# Patient Record
Sex: Male | Born: 1970 | Race: White | Hispanic: No | Marital: Married | Smoking: Never smoker
Health system: Southern US, Community
[De-identification: ages and names within clinical notes are randomized; demographics above are authoritative.]

## PROBLEM LIST (undated history)

## (undated) DIAGNOSIS — K219 Gastro-esophageal reflux disease without esophagitis: Secondary | ICD-10-CM

## (undated) DIAGNOSIS — T7840XA Allergy, unspecified, initial encounter: Secondary | ICD-10-CM

## (undated) DIAGNOSIS — M1990 Unspecified osteoarthritis, unspecified site: Secondary | ICD-10-CM

## (undated) HISTORY — DX: Allergy, unspecified, initial encounter: T78.40XA

## (undated) HISTORY — PX: COLONOSCOPY: SHX174

## (undated) HISTORY — DX: Unspecified osteoarthritis, unspecified site: M19.90

## (undated) HISTORY — DX: Gastro-esophageal reflux disease without esophagitis: K21.9

## (undated) HISTORY — PX: ANTERIOR CRUCIATE LIGAMENT REPAIR: SHX115

---

## 2013-03-02 ENCOUNTER — Ambulatory Visit: Payer: Self-pay | Admitting: Sports Medicine

## 2013-09-07 ENCOUNTER — Ambulatory Visit: Payer: Self-pay | Admitting: Sports Medicine

## 2013-09-09 ENCOUNTER — Ambulatory Visit: Payer: Self-pay | Admitting: Sports Medicine

## 2016-05-08 ENCOUNTER — Encounter: Payer: Self-pay | Admitting: Sports Medicine

## 2016-05-08 ENCOUNTER — Ambulatory Visit (INDEPENDENT_AMBULATORY_CARE_PROVIDER_SITE_OTHER): Payer: BC Managed Care – PPO | Admitting: Sports Medicine

## 2016-05-08 VITALS — BP 106/72 | HR 73 | Resp 18 | Wt 217.4 lb

## 2016-05-08 DIAGNOSIS — Z23 Encounter for immunization: Secondary | ICD-10-CM | POA: Diagnosis not present

## 2016-05-08 DIAGNOSIS — T161XXA Foreign body in right ear, initial encounter: Secondary | ICD-10-CM

## 2016-05-08 DIAGNOSIS — Z9622 Myringotomy tube(s) status: Secondary | ICD-10-CM | POA: Insufficient documentation

## 2016-05-08 DIAGNOSIS — Z0000 Encounter for general adult medical examination without abnormal findings: Secondary | ICD-10-CM

## 2016-05-08 NOTE — Addendum Note (Signed)
Addended by: Baird KayUGLAS, Mckenize Mezera M on: 05/08/2016 09:57 AM   Modules accepted: Orders

## 2016-05-08 NOTE — Assessment & Plan Note (Signed)
Removal with instrumentation of a detached tympanostomy tube.

## 2016-05-08 NOTE — Progress Notes (Signed)
  Subjective:    CC: Establish care/Physical.   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  Past medical history, Surgical history, Family history not pertinant except as noted below, Social history, Allergies, and medications have been entered into the medical record, reviewed, and no changes needed.   Review of Systems: No headache, visual changes, nausea, vomiting, diarrhea, constipation, dizziness, abdominal pain, skin rash, fevers, chills, night sweats, swollen lymph nodes, weight loss, chest pain, body aches, joint swelling, muscle aches, shortness of breath, mood changes, visual or auditory hallucinations.  Objective:    General: Well Developed, well nourished, and in no acute distress.  Neuro: Alert and oriented x3, extra-ocular muscles intact, sensation grossly intact. Cranial nerves II through XII are intact, motor, sensory, and coordinative functions are all intact. HEENT: Normocephalic, atraumatic, pupils equal round reactive to light, neck supple, no masses, no lymphadenopathy, thyroid nonpalpable. Oropharynx, nasopharynx, external ear canals are unremarkable with the exception of a foreign body visualized in the right external canal. Skin: Warm and dry, no rashes noted.  Cardiac: Regular rate and rhythm, no murmurs rubs or gallops.  Respiratory: Clear to auscultation bilaterally. Not using accessory muscles, speaking in full sentences.  Abdominal: Soft, nontender, nondistended, positive bowel sounds, no masses, no organomegaly.  Musculoskeletal: Shoulder, elbow, wrist, hip, knee, ankle stable, and with full range of motion.  Procedure:  Removal of right ear canal foreign body. Risks, benefits, alternatives explained to patient. Consent obtained. Time out conducted.  Using alligator forceps I grasped the  foreign body under direct visualization and it was removed.  Impression and Recommendations:    The patient was counselled, risk factors were discussed, anticipatory guidance given.  Foreign body of ear, right Removal with instrumentation of a detached tympanostomy tube.  Annual physical exam Unremarkable physical, tetanus and flu vaccines. Routine blood work ordered. Return in one year.

## 2016-05-08 NOTE — Assessment & Plan Note (Signed)
Unremarkable physical, tetanus and flu vaccines. Routine blood work ordered. Return in one year.

## 2016-05-13 ENCOUNTER — Encounter: Payer: Self-pay | Admitting: Sports Medicine

## 2016-05-13 ENCOUNTER — Ambulatory Visit (INDEPENDENT_AMBULATORY_CARE_PROVIDER_SITE_OTHER): Payer: BC Managed Care – PPO | Admitting: Sports Medicine

## 2016-05-13 DIAGNOSIS — H6091 Unspecified otitis externa, right ear: Secondary | ICD-10-CM

## 2016-05-13 MED ORDER — CIPROFLOXACIN-DEXAMETHASONE 0.3-0.1 % OT SUSP: 4.0000 [drp] | Freq: Two times a day (BID) | OTIC | 0 refills | Status: AC

## 2016-05-13 MED ORDER — AZITHROMYCIN 250 MG PO TABS: ORAL_TABLET | ORAL | 0 refills | Status: DC

## 2016-05-13 NOTE — Progress Notes (Signed)
  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  Unfortunately for the past couple of days she's had increasing pain and pressure in his right ear. Moderate, persistent without radiation. No constitutional symptoms, minimal upper respiratory symptoms including a mild nonproductive cough. No shortness of breath or chest pain.  Past medical history, Surgical history, Family history not pertinant except as noted below, Social history, Allergies, and medications have been entered into the medical record, reviewed, and no changes needed.   Review of Systems: No fevers, chills, night sweats, weight loss, chest pain, or shortness of breath.   Objective:    General: Well Developed, well nourished, and in no acute distress.  Neuro: Alert and oriented x3, extra-ocular muscles intact, sensation grossly intact.  HEENT: Normocephalic, atraumatic, pupils equal round reactive to light, neck supple, no masses, no lymphadenopathy, thyroid nonpalpable. Oropharynx, nasopharynx unremarkable, right ear canal is erythematous and swollen, minimal erythema with bulging and the tympanic membrane itself. Skin: Warm and dry, no rashes. Cardiac: Regular rate and rhythm, no murmurs rubs or gallops, no lower extremity edema.  Respiratory: Clear to auscultation bilaterally. Not using accessory muscles, speaking in full sentences.  Impression and Recommendations:    Otitis externa of right ear With some element of otitis media with tympanic erythema. Adding Ciprodex and azithromycin.  I spent 25 minutes with this patient, greater than 50% was face-to-face time counseling regarding the above diagnoses

## 2016-05-13 NOTE — Assessment & Plan Note (Signed)
With some element of otitis media with tympanic erythema. Adding Ciprodex and azithromycin.

## 2016-07-02 ENCOUNTER — Encounter: Payer: Self-pay | Admitting: Sports Medicine

## 2016-07-02 ENCOUNTER — Ambulatory Visit (INDEPENDENT_AMBULATORY_CARE_PROVIDER_SITE_OTHER): Payer: BC Managed Care – PPO | Admitting: Sports Medicine

## 2016-07-02 DIAGNOSIS — Z0000 Encounter for general adult medical examination without abnormal findings: Secondary | ICD-10-CM | POA: Diagnosis not present

## 2016-07-02 DIAGNOSIS — H6091 Unspecified otitis externa, right ear: Secondary | ICD-10-CM

## 2016-07-02 NOTE — Assessment & Plan Note (Signed)
Resolved, he did get his tubes placed again.

## 2016-07-02 NOTE — Assessment & Plan Note (Signed)
Still needs to get blood work done. Simply needed a form filled out for work.

## 2016-07-02 NOTE — Progress Notes (Signed)
  Subjective:    CC: Needs paperwork done  HPI: Jimmy Garcia has already had his physical, he has not yet done his blood work, simply needs some paperwork filled out.  Past medical history:  Negative.  See flowsheet/record as well for more information.  Surgical history: Negative.  See flowsheet/record as well for more information.  Family history: Negative.  See flowsheet/record as well for more information.  Social history: Negative.  See flowsheet/record as well for more information.  Allergies, and medications have been entered into the medical record, reviewed, and no changes needed.   Review of Systems: No fevers, chills, night sweats, weight loss, chest pain, or shortness of breath.   Objective:    General: Well Developed, well nourished, and in no acute distress.  Neuro: Alert and oriented x3, extra-ocular muscles intact, sensation grossly intact.  HEENT: Normocephalic, atraumatic, pupils equal round reactive to light, neck supple, no masses, no lymphadenopathy, thyroid nonpalpable.  Skin: Warm and dry, no rashes. Cardiac: Regular rate and rhythm, no murmurs rubs or gallops, no lower extremity edema.  Respiratory: Clear to auscultation bilaterally. Not using accessory muscles, speaking in full sentences.  Impression and Recommendations:    Annual physical exam Still needs to get blood work done. Simply needed a form filled out for work.  Otitis externa of right ear Resolved, he did get his tubes placed again.

## 2016-07-11 LAB — CBC
HCT: 44.1 % (ref 38.5–50.0)
Hemoglobin: 15.4 g/dL (ref 13.2–17.1)
MCH: 30.7 pg (ref 27.0–33.0)
MCHC: 34.9 g/dL (ref 32.0–36.0)
MCV: 88 fL (ref 80.0–100.0)
MPV: 11.6 fL (ref 7.5–12.5)
Platelets: 221 K/uL (ref 140–400)
RBC: 5.01 MIL/uL (ref 4.20–5.80)
RDW: 12.6 % (ref 11.0–15.0)
WBC: 5.1 K/uL (ref 3.8–10.8)

## 2016-07-11 LAB — HEMOGLOBIN A1C
Hgb A1c MFr Bld: 4.9 % (ref <5.7)
Mean Plasma Glucose: 94 mg/dL

## 2016-07-12 ENCOUNTER — Other Ambulatory Visit: Payer: Self-pay | Admitting: Sports Medicine

## 2016-07-12 LAB — COMPREHENSIVE METABOLIC PANEL
ALT: 15 U/L (ref 9–46)
AST: 21 U/L (ref 10–40)
Albumin: 4.3 g/dL (ref 3.6–5.1)
Alkaline Phosphatase: 51 U/L (ref 40–115)
BUN: 12 mg/dL (ref 7–25)
CO2: 24 mmol/L (ref 20–31)
Calcium: 9.3 mg/dL (ref 8.6–10.3)
Chloride: 106 mmol/L (ref 98–110)
Creat: 0.9 mg/dL (ref 0.60–1.35)
Glucose, Bld: 89 mg/dL (ref 65–99)
Potassium: 4.1 mmol/L (ref 3.5–5.3)
Sodium: 140 mmol/L (ref 135–146)
Total Bilirubin: 0.7 mg/dL (ref 0.2–1.2)
Total Protein: 6.4 g/dL (ref 6.1–8.1)

## 2016-07-12 LAB — HIV ANTIBODY (ROUTINE TESTING W REFLEX): HIV 1&2 Ab, 4th Generation: NONREACTIVE

## 2016-07-12 LAB — LIPID PANEL
Cholesterol: 158 mg/dL (ref <200)
HDL: 52 mg/dL (ref >40)
LDL Cholesterol: 85 mg/dL
Total CHOL/HDL Ratio: 3 Ratio (ref <5.0)
Triglycerides: 105 mg/dL (ref <150)
VLDL: 21 mg/dL (ref <30)

## 2016-07-12 LAB — VITAMIN D 25 HYDROXY (VIT D DEFICIENCY, FRACTURES): Vit D, 25-Hydroxy: 29 ng/mL — ABNORMAL LOW (ref 30–100)

## 2016-07-12 LAB — TSH: TSH: 0.64 m[IU]/L (ref 0.40–4.50)

## 2016-07-12 MED ORDER — VITAMIN D (ERGOCALCIFEROL) 1.25 MG (50000 UNIT) PO CAPS: 50000.0000 [IU] | ORAL_CAPSULE | ORAL | 0 refills | Status: DC

## 2016-09-03 ENCOUNTER — Other Ambulatory Visit: Payer: Self-pay | Admitting: Sports Medicine

## 2016-09-26 ENCOUNTER — Ambulatory Visit (INDEPENDENT_AMBULATORY_CARE_PROVIDER_SITE_OTHER): Payer: BC Managed Care – PPO

## 2016-09-26 ENCOUNTER — Ambulatory Visit (INDEPENDENT_AMBULATORY_CARE_PROVIDER_SITE_OTHER): Payer: BC Managed Care – PPO | Admitting: Sports Medicine

## 2016-09-26 DIAGNOSIS — M25511 Pain in right shoulder: Secondary | ICD-10-CM | POA: Diagnosis not present

## 2016-09-26 DIAGNOSIS — M19011 Primary osteoarthritis, right shoulder: Secondary | ICD-10-CM

## 2016-09-26 DIAGNOSIS — G8929 Other chronic pain: Secondary | ICD-10-CM

## 2016-09-26 MED ORDER — MELOXICAM 15 MG PO TABS: ORAL_TABLET | ORAL | 3 refills | Status: DC

## 2016-09-26 NOTE — Progress Notes (Signed)
  Subjective:    CC: Right shoulder pain  HPI: This is a pleasant 46 year old male, he comes in with a several year history of pain that he localizes over the anterior aspect of his right shoulder, worse with overhead activities with clicking and catching since playing baseball young age. Symptoms are moderate, persistent with radiation over the deltoid but not past the elbow.  Past medical history:  Negative.  See flowsheet/record as well for more information.  Surgical history: Negative.  See flowsheet/record as well for more information.  Family history: Negative.  See flowsheet/record as well for more information.  Social history: Negative.  See flowsheet/record as well for more information.  Allergies, and medications have been entered into the medical record, reviewed, and no changes needed.   Review of Systems: No fevers, chills, night sweats, weight loss, chest pain, or shortness of breath.   Objective:    General: Well Developed, well nourished, and in no acute distress.  Neuro: Alert and oriented x3, extra-ocular muscles intact, sensation grossly intact.  HEENT: Normocephalic, atraumatic, pupils equal round reactive to light, neck supple, no masses, no lymphadenopathy, thyroid nonpalpable.  Skin: Warm and dry, no rashes. Cardiac: Regular rate and rhythm, no murmurs rubs or gallops, no lower extremity edema.  Respiratory: Clear to auscultation bilaterally. Not using accessory muscles, speaking in full sentences. Right Shoulder: Inspection reveals no abnormalities, atrophy or asymmetry. Palpation is normal with no tenderness over AC joint or bicipital groove. ROM is full in all planes. Rotator cuff strength normal throughout. No signs of impingement with negative Neer and [name]'s tests, empty can. Speeds and Yergason's tests normal. Positive Obrien's, negative crank, negative clunk, and good stability. Normal scapular function observed. No painful arc and no drop arm  sign. No apprehension sign  Impression and Recommendations:    Right shoulder pain Classic labral signs. Physical therapy, x-rays, meloxicam. Return in 6 weeks, MR arthrogram if no better.  I spent 25 minutes with this patient, greater than 50% was face-to-face time counseling regarding the above diagnoses

## 2016-09-26 NOTE — Assessment & Plan Note (Signed)
Classic labral signs. Physical therapy, x-rays, meloxicam. Return in 6 weeks, MR arthrogram if no better.

## 2016-09-26 NOTE — Patient Instructions (Signed)
SLAP Lesions Superior labrum anterior posterior (SLAP) lesions are injuries to part of the connective tissue (cartilage) of the shoulder joint. The top of the upper arm bone (humerus) fits into a socket in the shoulder blade to form the shoulder joint. There is a firm rim of cartilage (labrum) around the edge of the socket. The labrum helps to deepen the socket and hold the humerus in place. If a certain part of the labrum becomes frayed or torn, it is called a SLAP lesion. A SLAP lesion can cause shoulder pain, instability, and weakness. SLAP lesions are common among athletes who play sports that involve repeated overhead movements. SLAP lesions may include a tear in the cord of tissue that attaches the muscle in the front of the upper arm to the shoulder blade (proximal biceps tendon). What are the causes? This condition may be caused by:  A sudden (acute) injury, which can result from:  Falling on an outstretched arm.  Movement of the shoulder joint out of its normal place (dislocation).  A direct hit to the shoulder.  Wear and tear over time, which can result from doing activities or sports that involve overhead arm movements. What increases the risk? The following factors may make you more likely to develop a SLAP lesion:  Having had a dislocated shoulder in the past.  Being age 44 or older.  Playing certain sports, such as:  Sports that involve repeated overhead movements, such as baseball or volleyball.  Sports that put backward pressure on the arms when the arms are overhead, such as gymnastics or basketball.  Contact sports.  Lifting weights. What are the signs or symptoms? The main symptom of this condition is shoulder pain that gets worse when lifting a heavy object or raising the arm overhead. Other signs and symptoms may include:  Feeling like your shoulder is locking, catching, grinding, or popping.  Loss of strength.  Stiffness and limited range of motion.  Loss  of throwing power ("dead arm"). How is this diagnosed? This condition may be diagnosed based on:  Your symptoms.  Your medical history.  A physical exam.  Imaging tests, such as MRIs. How is this treated? Treatment for this condition may include:  Resting your shoulder by avoiding activities that cause shoulder pain.  NSAIDs to help reduce pain and swelling.  Physical therapy to improve strength and range of motion.  Surgery. This may be done if other treatment methods do not help. Surgery may involve:  Removing frayed pieces of the labrum.  Repairing tears.  Reattaching the labrum.  Repairing the biceps tendon. Follow these instructions at home: Managing pain, stiffness, and swelling  If directed, put ice on the injured area.  Put ice in a plastic bag.  Place a towel between your skin and the bag.  Leave the ice on for 20 minutes, 2-3 times a day. Driving  Do not drive or operate heavy machinery while taking prescription pain medicine.  Ask your health care provider when it is safe for you to drive. Activity  Return to your normal activities as told by your health care provider. Ask your health care provider what activities are safe for you.  Do exercises as told by your health care provider. General instructions  Do not use any tobacco products, such as cigarettes, chewing tobacco, or e-cigarettes. Tobacco can delay bone healing. If you need help quitting, ask your health care provider.  Take over-the-counter and prescription medicines only as told by your health care provider.  Keep all follow-up visits as told by your health care provider. This is important. How is this prevented?  Be safe and responsible while being active to avoid falls.  Maintain physical fitness, including strength and flexibility. Contact a health care provider if:  Your symptoms have not improved after 6 months of treatment.  Your symptoms get worse instead of getting  better. This information is not intended to replace advice given to you by your health care provider. Make sure you discuss any questions you have with your health care provider. Document Released: 08/19/2005 Document Revised: 04/25/2016 Document Reviewed: 07/22/2015 Elsevier Interactive Patient Education  2017 [employer]

## 2016-10-04 ENCOUNTER — Encounter: Payer: Self-pay | Admitting: Physical Therapy

## 2016-10-04 ENCOUNTER — Ambulatory Visit (INDEPENDENT_AMBULATORY_CARE_PROVIDER_SITE_OTHER): Payer: BC Managed Care – PPO | Admitting: Physical Therapy

## 2016-10-04 DIAGNOSIS — R258 Other abnormal involuntary movements: Secondary | ICD-10-CM | POA: Diagnosis not present

## 2016-10-04 DIAGNOSIS — G8929 Other chronic pain: Secondary | ICD-10-CM | POA: Diagnosis not present

## 2016-10-04 DIAGNOSIS — M25511 Pain in right shoulder: Secondary | ICD-10-CM

## 2016-10-04 DIAGNOSIS — M6281 Muscle weakness (generalized): Secondary | ICD-10-CM | POA: Diagnosis not present

## 2016-10-04 DIAGNOSIS — R259 Unspecified abnormal involuntary movements: Secondary | ICD-10-CM

## 2016-10-04 NOTE — Patient Instructions (Addendum)
"  This Feels Great" Stretch - keep Rt shoulder complex down/level with left    Using support, step back, place feet shoulder width apart and tuck head between arms. Hold _30___ seconds. To end, walk toward support, place one hand on thigh, then the other, and slowly roll up to standing position. Repeat __1__ times. Do __2__ sessions per day.  Strengthening: Resisted Flexion - perform with both arms in front of a mirror, elevate hands keeping both shoulder blades drawn down.    No band -  Lift arms forward and up. Keep shoulders level Repeat __10__ times per set. Do _1___ sets per session. Do _1___ sessions per day.   Resisted External Rotation: in Neutral - Bilateral    Sit or stand, tubing in both hands, elbows at sides, bent to 90, forearms forward. Pinch shoulder blades together and rotate forearms out. Keep elbows at sides. Repeat __10-15__ times per set. Do __2-3_ sets per session. Do __1__ sessions per day. http://orth.exer.us/966   Copyright  VHI. All rights reserved.  Marland Kitchen.   .Marland Kitchen

## 2016-10-04 NOTE — Therapy (Signed)
[facility] [address] [place], [place], [address] Phone: [phone]   Fax:  [phone]  Physical Therapy Evaluation  Patient Details  Name: Jimmy PietyDavid L Mcclarty MRN: [id number] Date of Birth: 10-13-1970 Referring Provider: Dr Benjamin Stainhekkekandam  Encounter Date: 10/04/2016      PT End of Session - 10/04/16 1453    Visit Number 1   Number of Visits 4   Date for PT Re-Evaluation 11/01/16   PT Start Time 1453   PT Stop Time 1536   PT Time Calculation (min) 43 min   Activity Tolerance Patient tolerated treatment well      History reviewed. No pertinent past medical history.  Past Surgical History:  Procedure Laterality Date  . ANTERIOR CRUCIATE LIGAMENT REPAIR Right    01/2015    There were no vitals filed for this visit.       Subjective Assessment - 10/04/16 1454    Subjective  Pt reports he has had Rt shoulder pain for years, when he was young played a lot of sports and would dislocate with throwing. 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    Pertinent History 2 ACL replacements, currently only tracking steps for exercise.    Diagnostic tests x-ray - arthritis, possible torn labrum   Patient Stated Goals full ROM of the Rt shoulder,    Currently in Pain? Yes   Pain Score 3    Pain Location Shoulder   Pain Orientation Right;Anterior   Pain Descriptors / Indicators Dull;Aching   Pain Type Chronic pain   Pain Onset More than a month ago   Pain Frequency Constant   Aggravating Factors  throwing and using the arm   Pain Relieving Factors hasn't found it yet.            [facility] - 10/04/16 0001      Assessment   Medical Diagnosis Rt shoulder pain   Referring Provider Dr Benjamin Stainhekkekandam   Onset Date/Surgical Date 10/04/14   Hand Dominance Right   Next MD Visit 11/17/16   Prior Therapy not the right shoulder, yes on Lt T6-7  radiated pain to the shoulder     Precautions   Precautions None     Balance Screen   Has the patient fallen in the past 6 months No     Prior Function   Level of Independence Independent   Vocation Full time employment   Vocation Requirements school principle   Leisure golf, umpire, play basketball, 2 pugs     Observation/Other Assessments   Focus on Therapeutic Outcomes (FOTO)  32% limited     Posture/Postural Control   Posture/Postural Control Postural limitations   Postural Limitations Rounded Shoulders;Flexed trunk  Rt shoulder complex elevated      ROM / Strength   AROM / PROM / Strength AROM;Strength     AROM   Overall AROM Comments cervical WNL , scapulohumeral rhythm on the Rt is impaired.  Elevating as one unit    AROM Assessment Site Shoulder;Elbow   Right/Left Shoulder --  slight decrease in Rt shoulder flex/abduct compared to LT     Strength   Overall Strength Comments mid traps 5-/5, low traps 4-/5   Strength Assessment Site --  bilat shoulders/elbows WNL,      Flexibility   Soft Tissue Assessment /Muscle Length --  tight in bilat pecs     Palpation  Palpation comment slight tenderness around the Rt bicepital groove.      Special Tests    Special Tests Laxity/Instability Tests   Laxity/Instability  Anterior Apprehension test     Anterior Apprehension test   Findings Positive   Side Right                   [facility] Adult PT Treatment/Exercise - 10/04/16 0001      Exercises   Exercises Shoulder     Shoulder Exercises: Standing   External Rotation Both;20 reps;Theraband   Theraband Level (Shoulder External Rotation) Level 2 (Red)   Flexion Both;AROM  working on keeping scapulas depressed, correct rhythm   Other Standing Exercises sink stretch for shoulders and sides.                 PT Education - 10/04/16 1626    Education provided Yes   Education Details HEP   Person(s) Educated Patient   Methods  Explanation;Demonstration;Handout   Comprehension Returned demonstration;Verbalized understanding             PT Long Term Goals - 10/04/16 1630      PT LONG TERM GOAL #1   Title I with advanced HEP ( 11/01/16)    Time 4   Period Weeks   Status New     PT LONG TERM GOAL #2   Title demo painfree Rt shoulder ROM WNL ( 11/01/16)    Time 4   Period Weeks   Status New     PT LONG TERM GOAL #3   Title report ability to throw the ball from home plate to the pitchers mound without difficult ( 11/01/16)    Time 4   Period Weeks   Status New     PT LONG TERM GOAL #4   Title improve FOTO =/< 24% limited ( 11/01/16)    Time 4   Period Weeks   Status New     PT LONG TERM GOAL #5   Title increase upper back strength =/> 5-/5 to allow for normalized humeroscapular rhythm. ( 11/01/16)    Time 4   Period Weeks   Status New               Plan - 10/04/16 1628    Clinical Impression Statement 46 yo male with long standing h/o rt shoulder issues that began in youth with dislocations.  More recently he has been experiencing increased pain and difficulty throwing a ball.  He has impaired humeroscapular rhythm, weakness around the scapula and tightness in his lats and posterior shoulder muscles.    Rehab Potential Good   PT Frequency 1x / week  due to work schedule he can't attend more frequently.    PT Duration 4 weeks   PT Treatment/Interventions Moist Heat;Ultrasound;Therapeutic exercise;Dry needling;Taping;Vasopneumatic Device;Manual techniques;Neuromuscular re-education;Cryotherapy;Electrical Stimulation;Iontophoresis 4mg /ml Dexamethasone;Patient/family education;Passive range of motion   PT Next Visit Plan progress HEP shoulder stretches and scapular stabilization   Consulted and Agree with Plan of Care Patient      Patient will benefit from skilled therapeutic intervention in order to improve the following deficits and impairments:  Postural dysfunction, Decreased strength, Pain,  Impaired UE functional use, Increased muscle spasms, Decreased range of motion  Visit Diagnosis: Chronic right shoulder pain - Plan: PT plan of care cert/re-cert  Muscle weakness (generalized) - Plan: PT plan of care cert/re-cert  Other abnormal involuntary movements - Plan: PT plan of care cert/re-cert     Problem List Patient Active Problem  List   Diagnosis Date Noted  . Right shoulder pain 09/26/2016  . Annual physical exam 05/08/2016  . Otitis externa of right ear 05/08/2016    Roderic Scarce PT  10/04/2016, 4:35 PM  [facility] [address] [place], [place], [address] Phone: [phone]   Fax:  [phone]  Name: KHARTER BREW MRN: [id number] Date of Birth: May 09, 1971

## 2016-10-09 ENCOUNTER — Ambulatory Visit (INDEPENDENT_AMBULATORY_CARE_PROVIDER_SITE_OTHER): Payer: BC Managed Care – PPO

## 2016-10-09 DIAGNOSIS — R258 Other abnormal involuntary movements: Secondary | ICD-10-CM

## 2016-10-09 DIAGNOSIS — M6281 Muscle weakness (generalized): Secondary | ICD-10-CM | POA: Diagnosis not present

## 2016-10-09 DIAGNOSIS — G8929 Other chronic pain: Secondary | ICD-10-CM

## 2016-10-09 DIAGNOSIS — M25511 Pain in right shoulder: Secondary | ICD-10-CM | POA: Diagnosis not present

## 2016-10-09 DIAGNOSIS — R259 Unspecified abnormal involuntary movements: Secondary | ICD-10-CM

## 2016-10-09 NOTE — Therapy (Signed)
[facility] [address] [place], [place], [address] Phone: [phone]   Fax:  [phone]  Physical Therapy Treatment  Patient Details  Name: Jimmy PietyDavid L Burget MRN: [id number] Date of Birth: [birth year] Referring Provider: Dr Benjamin Stainhekkekandam  Encounter Date: 10/09/2016      PT End of Session - 10/09/16 0805    Visit Number 2   Number of Visits 4   Date for PT Re-Evaluation 11/01/16   PT Start Time 0800   PT Stop Time 0840   PT Time Calculation (min) 40 min   Activity Tolerance Patient tolerated treatment well      No past medical history on file.  Past Surgical History:  Procedure Laterality Date  . ANTERIOR CRUCIATE LIGAMENT REPAIR Right    01/2015    There were no vitals filed for this visit.      Subjective Assessment - 10/09/16 0803    Subjective Pt. reporting R shoulder with sharp pain yesterday while reaching to back seat in car.     Patient Stated Goals full ROM of the Rt shoulder,    Currently in Pain? No/denies   Pain Score 0-No pain   Multiple Pain Sites No            [facility] Adult PT Treatment/Exercise - 10/09/16 0811      Shoulder Exercises: Supine   Other Supine Exercises B shoulder ER laying on 1/2 foam boslter 5" x 10 reps   Other Supine Exercises B shoulder horizontal abduction with red TB 5" x 10 reps     Shoulder Exercises: Sidelying   External Rotation AROM;15 reps   External Rotation Weight (lbs) 3     Shoulder Exercises: Standing   External Rotation Both;20 reps;Theraband  on doorframe for scapular squeeze cue   Theraband Level (Shoulder External Rotation) Level 2 (Red)   Flexion Both;AROM  some cueing to prevent scapular elevation still required   Other Standing Exercises sink stretch for shoulders and sides.      Shoulder Exercises: Stretch   Corner Stretch 2 reps;30 seconds   Corner Stretch Limitations 3-way doorway stretch low/mid/high 2 x 30 sec each   Other Shoulder  Stretches R supine lats stretch with DKTC on red P-ball and therapist support x 1 min      Manual Therapy   Manual Therapy Scapular mobilization   Manual therapy comments medial scapular glide stretch in prone with R shoulder in flexion            PT Long Term Goals - 10/04/16 1630      PT LONG TERM GOAL #1   Title I with advanced HEP ( 11/01/16)    Time 4   Period Weeks   Status New     PT LONG TERM GOAL #2   Title demo painfree Rt shoulder ROM WNL ( 11/01/16)    Time 4   Period Weeks   Status New     PT LONG TERM GOAL #3   Title report ability to throw the ball from home plate to the pitchers mound without difficult ( 11/01/16)    Time 4   Period Weeks   Status New     PT LONG TERM GOAL #4   Title improve FOTO =/< 24% limited ( 11/01/16)    Time 4   Period Weeks   Status New     PT LONG TERM GOAL #5   Title increase upper back strength =/> 5-/5 to allow for normalized humeroscapular  rhythm. ( 11/01/16)    Time 4   Period Weeks   Status New               Plan - 10/09/16 1610    Clinical Impression Statement Today's treatment with HEP technique review, anterior chest stretching, and scapular stabilization activities.  Pt. responding well with all therex and able to progress to supine on  foam bolster scapular strengthening without issue.  Pt. reporting performing HEP consistently and with good recall and demo today.  Tactile cueing provided for all scapular retraction activities to encourage recruitment throughout therex today.  Pt. will continue to benefit from further skilled therapy to improve scapulohumeral rhythm, and return to leisure and work activities without pain.     PT Treatment/Interventions Moist Heat;Ultrasound;Therapeutic exercise;Dry needling;Taping;Vasopneumatic Device;Manual techniques;Neuromuscular re-education;Cryotherapy;Electrical Stimulation;Iontophoresis 4mg /ml Dexamethasone;Patient/family education;Passive range of motion   PT Next Visit Plan  progress HEP shoulder stretches and scapular stabilization      Patient will benefit from skilled therapeutic intervention in order to improve the following deficits and impairments:  Postural dysfunction, Decreased strength, Pain, Impaired UE functional use, Increased muscle spasms, Decreased range of motion  Visit Diagnosis: Chronic right shoulder pain  Muscle weakness (generalized)  Other abnormal involuntary movements     Problem List Patient Active Problem List   Diagnosis Date Noted  . Right shoulder pain 09/26/2016  . Annual physical exam 05/08/2016  . Otitis externa of right ear 05/08/2016    Kermit Balo, PTA 10/09/16 1:34 PM  [facility] [place] [address] [place], [place], [address] Phone: [phone]   Fax:  [phone]  Name: CASWELL ALVILLAR MRN: [id number] Date of Birth: 04/27/1971

## 2016-10-14 ENCOUNTER — Ambulatory Visit (INDEPENDENT_AMBULATORY_CARE_PROVIDER_SITE_OTHER): Payer: BC Managed Care – PPO | Admitting: Rehabilitative and Restorative Service Providers"

## 2016-10-14 ENCOUNTER — Encounter: Payer: Self-pay | Admitting: Rehabilitative and Restorative Service Providers"

## 2016-10-14 DIAGNOSIS — R258 Other abnormal involuntary movements: Secondary | ICD-10-CM

## 2016-10-14 DIAGNOSIS — G8929 Other chronic pain: Secondary | ICD-10-CM

## 2016-10-14 DIAGNOSIS — M6281 Muscle weakness (generalized): Secondary | ICD-10-CM

## 2016-10-14 DIAGNOSIS — R259 Unspecified abnormal involuntary movements: Secondary | ICD-10-CM

## 2016-10-14 DIAGNOSIS — M25511 Pain in right shoulder: Secondary | ICD-10-CM

## 2016-10-14 NOTE — Patient Instructions (Signed)
Self massage using ~4 inch plastic ball  Trigger Point Dry Needling  . What is Trigger Point Dry Needling (DN)? o DN is a physical therapy technique used to treat muscle pain and dysfunction. Specifically, DN helps deactivate muscle trigger points (muscle knots).  o A thin filiform needle is used to penetrate the skin and stimulate the underlying trigger point. The goal is for a local twitch response (LTR) to occur and for the trigger point to relax. No medication of any kind is injected during the procedure.   . What Does Trigger Point Dry Needling Feel Like?  o The procedure feels different for each individual patient. Some patients report that they do not actually feel the needle enter the skin and overall the process is not painful. Very mild bleeding may occur. However, many patients feel a deep cramping in the muscle in which the needle was inserted. This is the local twitch response.   Marland Kitchen. How Will I feel after the treatment? o Soreness is normal, and the onset of soreness may not occur for a few hours. Typically this soreness does not last longer than two days.  o Bruising is uncommon, however; ice can be used to decrease any possible bruising.  o In rare cases feeling tired or nauseous after the treatment is normal. In addition, your symptoms may get worse before they get better, this period will typically not last longer than 24 hours.   . What Can I do After My Treatment? o Increase your hydration by drinking more water for the next 24 hours. o You may place ice or heat on the areas treated that have become sore, however, do not use heat on inflamed or bruised areas. Heat often brings more relief post needling. o You can continue your regular activities, but vigorous activity is not recommended initially after the treatment for 24 hours. o DN is best combined with other physical therapy such as strengthening, stretching, and other therapies.    TENS UNIT: This is helpful for muscle  pain and spasm.   Search and Purchase a TENS 7000 2nd edition at www.tenspros.com. It should be less than $30.     TENS unit instructions: Do not shower or bathe with the unit on Turn the unit off before removing electrodes or batteries If the electrodes lose stickiness add a drop of water to the electrodes after they are disconnected from the unit and place on plastic sheet. If you continued to have difficulty, call the TENS unit company to purchase more electrodes. Do not apply lotion on the skin area prior to use. Make sure the skin is clean and dry as this will help prolong the life of the electrodes. After use, always check skin for unusual red areas, rash or other skin difficulties. If there are any skin problems, does not apply electrodes to the same area. Never remove the electrodes from the unit by pulling the wires. Do not use the TENS unit or electrodes other than as directed. Do not change electrode placement without consultating your therapist or physician. Keep 2 fingers with between each electrode.

## 2016-10-14 NOTE — Therapy (Signed)
[facility] [place] [address] [place], [place], [address] Phone: [phone]   Fax:  [phone]  Physical Therapy Treatment  Patient Details  Name: Jimmy Garcia MRN: [id number] Date of Birth: Nov 07, 1970 Referring Provider: Dr Benjamin Stain  Encounter Date: 10/14/2016      PT End of Session - 10/14/16 1522    Visit Number 3   Number of Visits 4   Date for PT Re-Evaluation 11/01/16   PT Start Time 1517   PT Stop Time 1607   PT Time Calculation (min) 50 min   Activity Tolerance Treatment limited secondary to medical complications (Comment)      History reviewed. No pertinent past medical history.  Past Surgical History:  Procedure Laterality Date  . ANTERIOR CRUCIATE LIGAMENT REPAIR Right    01/2015    There were no vitals filed for this visit.      Subjective Assessment - 10/14/16 1521    Subjective No significant changes in shoulder. It feels "unstable" for example when he rolls over on it at night. Just can't do what he could with his Rt UE. Shoulder feels sore after DN but feels the loosest it has felt in months - "it feels good". Interested in continuing with DN and shoulder rehab.    Currently in Pain? No/denies                         [facility] Adult PT Treatment/Exercise - 10/14/16 0001      Shoulder Exercises: ROM/Strengthening   UBE (Upper Arm Bike) L2 x 4 min alt each minute some discomfort with switching fwd to back      Shoulder Exercises: Lawyer Limitations 3-way doorway stretch low/mid/high 2 x 30 sec each   Other Shoulder Stretches supine prolonged snow angle on swim noodle 2-3 min; added trunk rotation to Lt with prolonged hold plus assisted stretch ant chest      Moist Heat Therapy   Number Minutes Moist Heat 15 Minutes   Moist Heat Location Shoulder  Rt/thoracic spine and shoulder girdle      Electrical Stimulation   Electrical Stimulation Location Rt  pecs/teres/posterior shoulder    Electrical Stimulation Action IFC   Electrical Stimulation Parameters to tolerance   Electrical Stimulation Goals Tone;Pain     Manual Therapy   Manual therapy comments pt supine    Joint Mobilization [facility] circumduction/inferior glide   Soft tissue mobilization pecs/teres/lats/ant deltoid    Myofascial Release pecs           Trigger Point Dry Needling - 10/14/16 1713    Consent Given? Yes   Education Handout Provided Yes   Muscles Treated Upper Body Pectoralis major;Pectoralis minor  teres Rt shd - decreased tightness/twitch   Pectoralis Major Response Twitch response elicited;Palpable increased muscle length   Pectoralis Minor Response Twitch response elicited;Palpable increased muscle length              PT Education - 10/14/16 1611    Education provided Yes   Education Details HEP; DN; TENS info    Person(s) Educated Patient   Methods Explanation;Demonstration;Tactile cues;Verbal cues;Handout   Comprehension Verbalized understanding;Returned demonstration;Verbal cues required;Tactile cues required             PT Long Term Goals - 10/14/16 1523      PT LONG TERM GOAL #1   Title I with advanced HEP ( 11/01/16)    Time 4   Period Weeks  Status On-going     PT LONG TERM GOAL #2   Title demo painfree Rt shoulder ROM WNL ( 11/01/16)    Time 4   Period Weeks   Status On-going     PT LONG TERM GOAL #3   Title report ability to throw the ball from home plate to the pitchers mound without difficult ( 11/01/16)    Time 4   Period Weeks   Status On-going     PT LONG TERM GOAL #4   Title improve FOTO =/< 24% limited ( 11/01/16)    Time 4   Period Weeks   Status On-going     PT LONG TERM GOAL #5   Title increase upper back strength =/> 5-/5 to allow for normalized humeroscapular rhythm. ( 11/01/16)    Time 4   Period Weeks   Status On-going               Plan - 10/14/16 1714    Clinical Impression Statement Continued  tightness and poor scapulothoracic and scapulohumeral rhythm. Added trial of DN to Rt pecs and teres with good response reported and decreased tightness to palpation noted. Patient continues to work on strengthening exercises and postural correction. Progressing toward stated goals of therapy - will benefit from additioinal PT to further resolve problems identified.    Rehab Potential Good   PT Frequency 1x / week   PT Duration 4 weeks   PT Treatment/Interventions Moist Heat;Ultrasound;Therapeutic exercise;Dry needling;Taping;Vasopneumatic Device;Manual techniques;Neuromuscular re-education;Cryotherapy;Electrical Stimulation;Iontophoresis 4mg /ml Dexamethasone;Patient/family education;Passive range of motion   PT Next Visit Plan progress HEP shoulder stretches and scapular stabilization; assess response to DN and consider DN to posterior shoulder girdle; manual work and Photographerneuromuscular re-education    Consulted and Agree with Plan of Care Patient      Patient will benefit from skilled therapeutic intervention in order to improve the following deficits and impairments:  Postural dysfunction, Decreased strength, Pain, Impaired UE functional use, Increased muscle spasms, Decreased range of motion  Visit Diagnosis: Chronic right shoulder pain  Muscle weakness (generalized)  Other abnormal involuntary movements     Problem List Patient Active Problem List   Diagnosis Date Noted  . Right shoulder pain 09/26/2016  . Annual physical exam 05/08/2016  . Otitis externa of right ear 05/08/2016    Jimmy Garcia Rober MinionP Jacai Kipp PT, MPH  10/14/2016, 5:18 PM  [facility] [address] [place], [place], [address] Phone: [phone]   Fax:  [phone]  Name: Jimmy PietyDavid L Garcia MRN: [id number] Date of Birth: 13-Feb-1971

## 2016-10-24 ENCOUNTER — Encounter: Payer: Self-pay | Admitting: Physical Therapy

## 2016-10-24 ENCOUNTER — Ambulatory Visit (INDEPENDENT_AMBULATORY_CARE_PROVIDER_SITE_OTHER): Payer: BC Managed Care – PPO | Admitting: Physical Therapy

## 2016-10-24 DIAGNOSIS — R258 Other abnormal involuntary movements: Secondary | ICD-10-CM

## 2016-10-24 DIAGNOSIS — M6281 Muscle weakness (generalized): Secondary | ICD-10-CM | POA: Diagnosis not present

## 2016-10-24 DIAGNOSIS — G8929 Other chronic pain: Secondary | ICD-10-CM

## 2016-10-24 DIAGNOSIS — R259 Unspecified abnormal involuntary movements: Secondary | ICD-10-CM | POA: Diagnosis not present

## 2016-10-24 DIAGNOSIS — M25511 Pain in right shoulder: Secondary | ICD-10-CM | POA: Diagnosis not present

## 2016-10-24 NOTE — Therapy (Signed)
[place] [address] [place], [place], [address] Phone: [phone]   Fax:  [phone]  Physical Therapy Treatment  Patient Details  Name: Jimmy Garcia MRN: [id number] Date of Birth: 1971-03-16 Referring Provider: Dr Dianah Field  Encounter Date: 10/24/2016      PT End of Session - 10/24/16 1708    Visit Number 4   Number of Visits 7   Date for PT Re-Evaluation 11/14/16   PT Start Time 1708   PT Stop Time 1806   PT Time Calculation (min) 58 min   Activity Tolerance Patient tolerated treatment well      History reviewed. No pertinent past medical history.  Past Surgical History:  Procedure Laterality Date  . ANTERIOR CRUCIATE LIGAMENT REPAIR Right    01/2015    There were no vitals filed for this visit.      Subjective Assessment - 10/24/16 1709    Subjective Paco felt the DN and trigger point release with the ball has helped.    Patient Stated Goals full ROM of the Rt shoulder,    Currently in Pain? No/denies                         [facility] Adult PT Treatment/Exercise - 10/24/16 0001      Shoulder Exercises: Seated   External Rotation Theraband;Both  30 reps,    Theraband Level (Shoulder External Rotation) Level 3 (Green)     Shoulder Exercises: Standing   Horizontal ABduction Strengthening;Right;Theraband;20 reps   Theraband Level (Shoulder Horizontal ABduction) Level 3 (Green)   Row Strengthening;Both;Theraband  with ER per picture   Theraband Level (Shoulder Row) Level 2 (Red)   Other Standing Exercises SASH with green band, standing and throwing to the rebounder, green ball and red ball.      Shoulder Exercises: ROM/Strengthening   UBE (Upper Arm Bike) L3 x4' alt FWD/BWD     Modalities   Modalities Electrical Stimulation;Moist Heat     Moist Heat Therapy   Number Minutes Moist Heat 10 Minutes   Moist Heat Location Shoulder  rt     Electrical Stimulation   Electrical Stimulation Location Rt shoulder complex   Electrical Stimulation Action IFC   Electrical Stimulation Parameters to tolerance   Electrical Stimulation Goals Tone;Pain     Manual Therapy   Manual therapy comments pt prone    Soft tissue mobilization pecs/teres/lats/ant deltoid   upper traps, levator          Trigger Point Dry Needling - 10/24/16 1732    Consent Given? Yes   Education Handout Provided No   Muscles Treated Upper Body Levator scapulae;Upper trapezius  teres minor/major all Rt side   Upper Trapezius Response Palpable increased muscle length;Twitch reponse elicited   Levator Scapulae Response Palpable increased muscle length;Twitch response elicited                   PT Long Term Goals - 10/24/16 1713      PT LONG TERM GOAL #1   Title I with advanced HEP ( 11/14/16)    Status On-going     PT LONG TERM GOAL #2   Title demo painfree Rt shoulder ROM WNL ( 11/14/16)    Status Partially Met  slight discomfort with reaching behind back in Rt anterior shoulder     PT LONG TERM GOAL #3   Title report ability to throw the ball from home plate to the pitchers mound  without difficult ( 11/01/16)      PT LONG TERM GOAL #4   Title improve FOTO =/< 24% limited ( 11/14/16)    Status On-going  scored 30% limited     PT LONG TERM GOAL #5   Title increase upper back strength =/> 5-/5 to allow for normalized humeroscapular rhythm. ( 11/01/16)    Status Achieved               Plan - 10/24/16 1800    Clinical Impression Statement Rico is making progess, partially met his goals. Had relief with DN, increased motion. His scapulothoracic rhythm has improved.  He has been hesitiant to perform full throws however did well in the clinic with it today and will start performing this. He would benefit from a couple more treatments to further progress and restore his prior level .    Rehab Potential Good   PT Frequency 1x / week   PT Duration 3 weeks   PT  Treatment/Interventions Moist Heat;Ultrasound;Therapeutic exercise;Dry needling;Taping;Vasopneumatic Device;Manual techniques;Neuromuscular re-education;Cryotherapy;Electrical Stimulation;Iontophoresis 52m/ml Dexamethasone;Patient/family education;Passive range of motion   PT Next Visit Plan progress HEP shoulder stretches and scapular stabilization; assess response to DN; manual work and nScientist, forensicand Agree with Plan of Care Patient      Patient will benefit from skilled therapeutic intervention in order to improve the following deficits and impairments:  Postural dysfunction, Decreased strength, Pain, Impaired UE functional use, Increased muscle spasms, Decreased range of motion  Visit Diagnosis: Chronic right shoulder pain - Plan: PT plan of care cert/re-cert  Muscle weakness (generalized) - Plan: PT plan of care cert/re-cert  Other abnormal involuntary movements - Plan: PT plan of care cert/re-cert     Problem List Patient Active Problem List   Diagnosis Date Noted  . Right shoulder pain 09/26/2016  . Annual physical exam 05/08/2016  . Otitis externa of right ear 05/08/2016    SBoneta LucksrPT  10/24/2016, 6:15 PM  [facility] [address]Phone: [phone]  Fax:  [phone] Name: DMALIKAI GUTMRN: [id number] of Birth: [birth year]

## 2016-10-24 NOTE — Patient Instructions (Addendum)
PNF Strengthening: Resisted    Standing with resistive band around each hand, bring right arm up and away, thumb back. Repeat __10__ times per set. Do __2-3__ sets per session. Do _every other ___ day.  Resisted External Rotation: Abduction - Bilateral - red band    Face anchor, tubing end in each hand. Keep arms elevated and elbows bent to 90, forearms forward. Pinch shoulder blades together and rotate forearms up. Repeat __10__ times per set. Do __2-3__ sets per session. Do __every other__ day.  Strengthening: Resisted Horizontal Abduction - green band    Hold tubing in right hand, elbow straight, arm in, parallel to floor. Pull arm out from side through pain-free range. Repeat __10__ times per set. Do _2-3___ sets per session. Do __every other __ day. Copyright  [employer]. All rights reserved.

## 2016-10-30 ENCOUNTER — Encounter: Payer: Self-pay | Admitting: Physical Therapy

## 2016-10-30 ENCOUNTER — Ambulatory Visit (INDEPENDENT_AMBULATORY_CARE_PROVIDER_SITE_OTHER): Payer: BC Managed Care – PPO | Admitting: Physical Therapy

## 2016-10-30 DIAGNOSIS — R258 Other abnormal involuntary movements: Secondary | ICD-10-CM

## 2016-10-30 DIAGNOSIS — M6281 Muscle weakness (generalized): Secondary | ICD-10-CM | POA: Diagnosis not present

## 2016-10-30 DIAGNOSIS — R259 Unspecified abnormal involuntary movements: Secondary | ICD-10-CM

## 2016-10-30 DIAGNOSIS — G8929 Other chronic pain: Secondary | ICD-10-CM | POA: Diagnosis not present

## 2016-10-30 DIAGNOSIS — M25511 Pain in right shoulder: Secondary | ICD-10-CM

## 2016-10-30 NOTE — Therapy (Addendum)
[facility] [place], [place], [address] Phone: [phone]   Fax:  [phone]  Physical Therapy Treatment  Patient Details  Name: Jimmy Garcia MRN: [id number] Date of Birth: 04-15-1971 Referring Provider: Dr Dianah Field  Encounter Date: 10/30/2016      PT End of Session - 10/30/16 1603    Visit Number 5   Number of Visits 7   Date for PT Re-Evaluation 11/14/16   PT Start Time 1603   PT Stop Time 1702   PT Time Calculation (min) 59 min   Activity Tolerance Patient tolerated treatment well      History reviewed. No pertinent past medical history.  Past Surgical History:  Procedure Laterality Date  . ANTERIOR CRUCIATE LIGAMENT REPAIR Right    01/2015    There were no vitals filed for this visit.      Subjective Assessment - 10/30/16 1606    Subjective Azan attempted some throwing overhead at school and had pinching in the top of the Rt shoulder at acromion process area. That is the only time he has pain.    Currently in Pain? No/denies                         [facility] Adult PT Treatment/Exercise - 10/30/16 0001      Shoulder Exercises: Seated   Other Seated Exercises full can 45 degrees from body, 3# each hand 10 reps each bilat, single, alternating.      Shoulder Exercises: Prone   External Rotation Strengthening;Right;Weights  arm dangling off EOB, 3x8   External Rotation Weight (lbs) 2     Shoulder Exercises: ROM/Strengthening   UBE (Upper Arm Bike) L4x4' on BOSU ball, alt FWD/BWD     Modalities   Modalities Electrical Stimulation;Moist Heat     Moist Heat Therapy   Number Minutes Moist Heat 13 Minutes   Moist Heat Location Shoulder;Cervical     Electrical Stimulation   Electrical Stimulation Location Rt shoulder complex   Electrical Stimulation Action IFC   Electrical Stimulation Parameters to tolerance   Electrical Stimulation Goals Tone;Pain     Manual  Therapy   Manual Therapy Soft tissue mobilization;Joint mobilization   Joint Mobilization Rt shoulder IR/ER    Soft tissue mobilization STW to Rt upper trap, levator, rhomoids, posterior shoulder musculature          Trigger Point Dry Needling - 10/30/16 1654    Consent Given? Yes   Education Handout Provided No   Muscles Treated Upper Body Upper trapezius;Levator scapulae;Infraspinatus;Subscapularis  deltoid RT   Upper Trapezius Response Palpable increased muscle length;Twitch reponse elicited  Rt with stim   Levator Scapulae Response Palpable increased muscle length;Twitch response elicited   Infraspinatus Response Palpable increased muscle length;Twitch response elicited   Subscapularis Response Palpable increased muscle length;Twitch response elicited                   PT Long Term Goals - 10/30/16 1657      PT LONG TERM GOAL #1   Title I with advanced HEP ( 11/14/16)    Status On-going     PT LONG TERM GOAL #2   Title demo painfree Rt shoulder ROM WNL ( 11/14/16)    Status Partially Met     PT LONG TERM GOAL #3   Title report ability to throw the ball from home plate to the pitchers mound without difficult ( 11/01/16)    Status On-going  PT LONG TERM GOAL #4   Title improve FOTO =/< 24% limited ( 11/14/16)    Status On-going     PT LONG TERM GOAL #5   Title increase upper back strength =/> 5-/5 to allow for normalized humeroscapular rhythm. ( 11/01/16)    Status Achieved               Plan - 10/30/16 1658    Clinical Impression Statement Pt continues to make progress. His RTC strength is improving. He only has pain when the rightr arm ia abducted and cocked to throw. Progressing to goals. He is having good releases with DN, the tightness is decreasing, somenew areas in the deltoid.   Rehab Potential Good   PT Frequency 1x / week   PT Duration 3 weeks   PT Treatment/Interventions Moist Heat;Ultrasound;Therapeutic exercise;Dry  needling;Taping;Vasopneumatic Device;Manual techniques;Neuromuscular re-education;Cryotherapy;Electrical Stimulation;Iontophoresis 58m/ml Dexamethasone;Patient/family education;Passive range of motion   Consulted and Agree with Plan of Care Patient      Patient will benefit from skilled therapeutic intervention in order to improve the following deficits and impairments:  Postural dysfunction, Decreased strength, Pain, Impaired UE functional use, Increased muscle spasms, Decreased range of motion  Visit Diagnosis: Chronic right shoulder pain  Muscle weakness (generalized)  Other abnormal involuntary movements     Problem List Patient Active Problem List   Diagnosis Date Noted  . Right shoulder pain 09/26/2016  . Annual physical exam 05/08/2016  . Otitis externa of right ear 05/08/2016    SJeral PinchPT 10/30/2016, [id number] [address]Phone: [phone]  Fax:  [phone] Name: DBRANDIN STETZERMRN: [id number] of Birth: [birth year] PHYSICAL THERAPY DISCHARGE SUMMARY  Visits from Start of Care: 5  Current functional level related to goals / functional outcomes: Unknown, according to notes patient is opting to have injections.    Remaining deficits: unknown   Education / Equipment: HEP, DN Plan:                                                    Patient goals were not met. Patient is being discharged due to not returning since the last visit.  ?????    SJeral Pinch PT 12/12/16 9:02 AM

## 2016-11-07 ENCOUNTER — Ambulatory Visit (INDEPENDENT_AMBULATORY_CARE_PROVIDER_SITE_OTHER): Payer: BC Managed Care – PPO | Admitting: Sports Medicine

## 2016-11-07 DIAGNOSIS — M19011 Primary osteoarthritis, right shoulder: Secondary | ICD-10-CM

## 2016-11-07 NOTE — Progress Notes (Signed)
  Subjective:    CC: Follow-up  HPI: Right shoulder pain: Glenohumeral, has done much better with physical therapy, meloxicam, unfortunately still has some pain with abduction and external rotation and doesn't feel confident throwing a ball. Pain is moderate, persistent.  Past medical history:  Negative.  See flowsheet/record as well for more information.  Surgical history: Negative.  See flowsheet/record as well for more information.  Family history: Negative.  See flowsheet/record as well for more information.  Social history: Negative.  See flowsheet/record as well for more information.  Allergies, and medications have been entered into the medical record, reviewed, and no changes needed.   Review of Systems: No fevers, chills, night sweats, weight loss, chest pain, or shortness of breath.   Objective:    General: Well Developed, well nourished, and in no acute distress.  Neuro: Alert and oriented x3, extra-ocular muscles intact, sensation grossly intact.  HEENT: Normocephalic, atraumatic, pupils equal round reactive to light, neck supple, no masses, no lymphadenopathy, thyroid nonpalpable.  Skin: Warm and dry, no rashes. Cardiac: Regular rate and rhythm, no murmurs rubs or gallops, no lower extremity edema.  Respiratory: Clear to auscultation bilaterally. Not using accessory muscles, speaking in full sentences. Right Shoulder: Inspection reveals no abnormalities, atrophy or asymmetry. Palpation is normal with no tenderness over AC joint or bicipital groove. ROM is full in all planes. Rotator cuff strength normal throughout. No signs of impingement with negative Neer and [name]'s tests, empty can. Speeds and Yergason's tests normal. Pain with abduction and external rotation passively Normal scapular function observed. No painful arc and no drop arm sign. No apprehension sign  Procedure: Real-time Ultrasound Guided Injection of right glenohumeral joint Device: GE Logiq E    Verbal informed consent obtained.  Time-out conducted.  Noted no overlying erythema, induration, or other signs of local infection.  Skin prepped in a sterile fashion.  Local anesthesia: Topical Ethyl chloride.  With sterile technique and under real time ultrasound guidance:  Using a 22-gauge spinal needle advanced into the joint injection to avoid the labrum and injected 1 mL kenalog 40, 2 mL lidocaine, 2 mL bupivacaine Completed without difficulty  Pain immediately resolved suggesting accurate placement of the medication.  Advised to call if fevers/chills, erythema, induration, drainage, or persistent bleeding.  Images permanently stored and available for review in the ultrasound unit.  Impression: Technically successful ultrasound guided injection.  Impression and Recommendations:    Primary osteoarthritis of right shoulder Pain is predominantly glenohumeral. He has responded well to physical therapy, meloxicam but still has significant pain with abduction and external rotation. At this point he is interested in interventional treatment, glenohumeral joint injection as above. Return to see me in one month.

## 2016-11-07 NOTE — Assessment & Plan Note (Signed)
Pain is predominantly glenohumeral. He has responded well to physical therapy, meloxicam but still has significant pain with abduction and external rotation. At this point he is interested in interventional treatment, glenohumeral joint injection as above. Return to see me in one month.

## 2016-11-13 ENCOUNTER — Encounter: Payer: BC Managed Care – PPO | Admitting: Rehabilitative and Restorative Service Providers"

## 2016-11-14 ENCOUNTER — Encounter: Payer: BC Managed Care – PPO | Admitting: Physical Therapy

## 2016-11-14 ENCOUNTER — Ambulatory Visit (INDEPENDENT_AMBULATORY_CARE_PROVIDER_SITE_OTHER): Payer: BC Managed Care – PPO | Admitting: Sports Medicine

## 2016-11-14 ENCOUNTER — Ambulatory Visit (INDEPENDENT_AMBULATORY_CARE_PROVIDER_SITE_OTHER): Payer: BC Managed Care – PPO

## 2016-11-14 ENCOUNTER — Encounter: Payer: Self-pay | Admitting: Sports Medicine

## 2016-11-14 DIAGNOSIS — B349 Viral infection, unspecified: Secondary | ICD-10-CM

## 2016-11-14 DIAGNOSIS — R0989 Other specified symptoms and signs involving the circulatory and respiratory systems: Secondary | ICD-10-CM

## 2016-11-14 DIAGNOSIS — R05 Cough: Secondary | ICD-10-CM | POA: Diagnosis not present

## 2016-11-14 NOTE — Assessment & Plan Note (Signed)
Out of the window for Tamiflu. Slight coarse sounds in the left upper lobe, checking a chest x-ray. May continue DayQuil, NyQuil and meloxicam, keep hydrated, return if no better in 2 weeks.

## 2016-11-14 NOTE — Progress Notes (Signed)
  Subjective:    CC:  Muscle aches, fatigue, cold symptoms  HPI: Pt presents with muscle aches, fatigue, URI symptoms since Monday (4 days). Reports subjective fever/chills after the first day that have since resolved.  Past medical history:  Negative.  See flowsheet/record as well for more information.  Surgical history: Negative.  See flowsheet/record as well for more information.  Family history: Negative.  See flowsheet/record as well for more information.  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  See flowsheet/record as well for more information.  Allergies, and medications have been entered into the medical record, reviewed, and no changes needed.   Review of Systems: reports subjective fever, chills, night sweats; no weight loss, chest pain, or shortness of breath. Reports GI distress related to use other medications, unrelated to present illness.   Objective:    General: Well Developed, well nourished, and in no acute distress.  Neuro: Alert and oriented x3, extra-ocular muscles intact, sensation grossly intact.  HEENT: Normocephalic, atraumatic, pupils equal round reactive to light, neck supple, no masses, no lymphadenopathy, thyroid nonpalpable. Denies sinus pain on palpation.Oropharynx, nasopharynx unremarkable, tympanic membranes are clear, dislodged tube in the right canal was removed with alligator forceps. Skin: Warm and dry, no rashes. Cardiac: Regular rate and rhythm, no murmurs rubs or gallops, no lower extremity edema.  Respiratory: Coarse breath sounds in left upper lobe,. Not using accessory muscles, speaking in full sentences.  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 Patient advised to use miloxecam to manage symptoms. Additionally, coarse breath sounds appreciated in upper lobe of left lung. CXR ordered to evaluate.

## 2016-11-19 ENCOUNTER — Encounter: Payer: Self-pay | Admitting: Sports Medicine

## 2016-11-19 ENCOUNTER — Ambulatory Visit (INDEPENDENT_AMBULATORY_CARE_PROVIDER_SITE_OTHER): Payer: BC Managed Care – PPO | Admitting: Sports Medicine

## 2016-11-19 DIAGNOSIS — B349 Viral infection, unspecified: Secondary | ICD-10-CM | POA: Diagnosis not present

## 2016-11-19 MED ORDER — BENZONATATE 200 MG PO CAPS: 200.0000 mg | ORAL_CAPSULE | Freq: Three times a day (TID) | ORAL | 0 refills | Status: DC | PRN

## 2016-11-19 MED ORDER — HYDROCOD POLST-CPM POLST ER 10-8 MG/5ML PO SUER: 5.0000 mL | Freq: Two times a day (BID) | ORAL | 0 refills | Status: DC | PRN

## 2016-11-19 NOTE — Assessment & Plan Note (Signed)
Has a persistent cough, x-ray was negative, exam is negative. This is likely a postinfectious cough, adding Tussionex at night and Tessalon Perles during the day, return to see me as needed.

## 2016-11-19 NOTE — Progress Notes (Signed)
  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 It's to the point where his wife is sleeping in another bed. At this point his main concern is stopping his cough.  Past medical history:  Negative.  See flowsheet/record as well for more information.  Surgical history: Negative.  See flowsheet/record as well for more information.  Family history: Negative.  See flowsheet/record as well for more information.  Social history: Negative.  See flowsheet/record as well for more information.  Allergies, and medications have been entered into the medical record, reviewed, and no changes needed.   Review of Systems: No fevers, chills, night sweats, weight loss, chest pain, or shortness of breath.   Objective:    General: Well Developed, well nourished, and in no acute distress.  Neuro: Alert and oriented x3, extra-ocular muscles intact, sensation grossly intact.  HEENT: Normocephalic, atraumatic, pupils equal round reactive to light, neck supple, no masses, no lymphadenopathy, thyroid nonpalpable. Oropharynx, nasopharynx, ear canals Unremarkable. Skin: Warm and dry, no rashes. Cardiac: Regular rate and rhythm, no murmurs rubs or gallops, no lower extremity edema.  Respiratory: Clear to auscultation bilaterally. Not using accessory muscles, speaking in full sentences.  Impression and Recommendations:    Viral syndrome Has a persistent cough, x-ray was negative, exam is negative. This is likely a postinfectious cough, adding Tussionex at night and Tessalon Perles during the day, return to see me as needed.  I spent 25 minutes with this patient, greater than 50% was face-to-face time counseling regarding the above diagnoses

## 2016-12-02 ENCOUNTER — Ambulatory Visit: Payer: BC Managed Care – PPO | Admitting: Sports Medicine

## 2016-12-19 ENCOUNTER — Ambulatory Visit: Payer: BC Managed Care – PPO | Admitting: Physician Assistant

## 2016-12-20 ENCOUNTER — Encounter: Payer: Self-pay | Admitting: Physician Assistant

## 2016-12-20 ENCOUNTER — Ambulatory Visit (INDEPENDENT_AMBULATORY_CARE_PROVIDER_SITE_OTHER): Payer: BC Managed Care – PPO | Admitting: Physician Assistant

## 2016-12-20 VITALS — BP 112/77 | HR 56 | Temp 97.9°F | Wt 216.0 lb

## 2016-12-20 DIAGNOSIS — J302 Other seasonal allergic rhinitis: Secondary | ICD-10-CM | POA: Diagnosis not present

## 2016-12-20 DIAGNOSIS — R05 Cough: Secondary | ICD-10-CM | POA: Diagnosis not present

## 2016-12-20 DIAGNOSIS — R059 Cough, unspecified: Secondary | ICD-10-CM

## 2016-12-20 MED ORDER — MONTELUKAST SODIUM 10 MG PO TABS: 10.0000 mg | ORAL_TABLET | Freq: Every day | ORAL | 3 refills | Status: DC

## 2016-12-20 MED ORDER — LEVOCETIRIZINE DIHYDROCHLORIDE 5 MG PO TABS: 5.0000 mg | ORAL_TABLET | Freq: Every evening | ORAL | 3 refills | Status: DC

## 2016-12-20 MED ORDER — IPRATROPIUM BROMIDE 0.06 % NA SOLN: 1.0000 | Freq: Four times a day (QID) | NASAL | 3 refills | Status: DC | PRN

## 2016-12-20 NOTE — Patient Instructions (Signed)
-   Singulair and Xyzal nightly - Flonase 2 sprays each nostril once a day - Atrovent 1 spray each nostril up to 4 times daily - Follow-up with Dr. Karie Schwalbe if no improvement in 2 weeks  Allergic Rhinitis Allergic rhinitis is when the mucous membranes in the nose respond to allergens. Allergens are particles in the air that cause your body to have an allergic reaction. This causes you to release allergic antibodies. Through a chain of events, these eventually cause you to release histamine into the blood stream. Although meant to protect the body, it is this release of histamine that causes your discomfort, such as frequent sneezing, congestion, and an itchy, runny nose. What are the causes? Seasonal allergic rhinitis (hay fever) is caused by pollen allergens that may come from grasses, trees, and weeds. Year-round allergic rhinitis (perennial allergic rhinitis) is caused by allergens such as house dust mites, pet dander, and mold spores. What are the signs or symptoms?  Nasal stuffiness (congestion).  Itchy, runny nose with sneezing and tearing of the eyes. How is this diagnosed? Your health care provider can help you determine the allergen or allergens that trigger your symptoms. If you and your health care provider are unable to determine the allergen, skin or blood testing may be used. Your health care provider will diagnose your condition after taking your health history and performing a physical exam. Your health care provider may assess you for other related conditions, such as asthma, pink eye, or an ear infection. How is this treated? Allergic rhinitis does not have a cure, but it can be controlled by:  Medicines that block allergy symptoms. These may include allergy shots, nasal sprays, and oral antihistamines.  Avoiding the allergen. Hay fever may often be treated with antihistamines in pill or nasal spray forms. Antihistamines block the effects of histamine. There are over-the-counter  medicines that may help with nasal congestion and swelling around the eyes. Check with your health care provider before taking or giving this medicine. If avoiding the allergen or the medicine prescribed do not work, there are many new medicines your health care provider can prescribe. Stronger medicine may be used if initial measures are ineffective. Desensitizing injections can be used if medicine and avoidance does not work. Desensitization is when a patient is given ongoing shots until the body becomes less sensitive to the allergen. Make sure you follow up with your health care provider if problems continue. Follow these instructions at home: It is not possible to completely avoid allergens, but you can reduce your symptoms by taking steps to limit your exposure to them. It helps to know exactly what you are allergic to so that you can avoid your specific triggers. Contact a health care provider if:  You have a fever.  You develop a cough that does not stop easily (persistent).  You have shortness of breath.  You start wheezing.  Symptoms interfere with normal daily activities. This information is not intended to replace advice given to you by your health care provider. Make sure you discuss any questions you have with your health care provider. Document Released: 05/14/2001 Document Revised: 04/19/2016 Document Reviewed: 04/26/2013 Elsevier Interactive Patient Education  2017 [employer].

## 2016-12-20 NOTE — Progress Notes (Signed)
HPI:                                                                Jimmy Garcia is a 46 y.o. male who presents to [facility] today for cough  Patient presents with cough for approximately 10 days. Cough is nonproductive and present throughout the day and night. Denies shortness of breath or wheezing. Endorses nasal congestion, rhinorrhea and postnasal drip. Denies fever, chills, malaise. Patient is taking Allegra, Zyrtec, and Flonase. Patient is a nonsmoker. Denies history of asthma or pulmonary disease.  Patient states his URI and cough that he was seen for in March resolved and he feels this cough is related to allergies.   No past medical history on file. Past Surgical History:  Procedure Laterality Date  . ANTERIOR CRUCIATE LIGAMENT REPAIR Right    01/2015   Social History  Substance Use Topics  . Smoking status: Never Smoker  . Smokeless tobacco: Never Used  . Alcohol use No   family history includes Cancer in his paternal grandfather.  ROS: negative except as noted in the HPI  Medications: Current Outpatient Prescriptions  Medication Sig Dispense Refill  . fluticasone (FLONASE) 50 MCG/ACT nasal spray Place 2 sprays into both nostrils daily.    . Multiple Vitamins-Minerals (MULTIVITAMIN WITH MINERALS) tablet Take 1 tablet by mouth daily.    Marland Kitchen FLUoxetine (PROZAC) 20 MG capsule TAKE 1 CAPSULE BY MOUTH EVERY DAY    . meloxicam (MOBIC) 15 MG tablet One tab PO qAM with breakfast for 2 weeks, then daily prn pain. 30 tablet 3  . OXYCONTIN 15 MG 12 hr tablet     . Vitamin D, Ergocalciferol, (DRISDOL) 50000 units CAPS capsule Take 1 capsule (50,000 Units total) by mouth every 7 (seven) days. Take for 8 total doses(weeks) 8 capsule 0   No current facility-administered medications for this visit.    Allergies  Allergen Reactions  . Sulfa Antibiotics        Objective:  BP 112/77   Pulse (!) 56   Temp 97.9 F (36.6 C)  (Oral)   Wt 216 lb (98 kg)  Gen: well-groomed, cooperative, not ill-appearing, no distress HEENT: normal conjunctiva, TM's clear, nasal mucosa edematous, rhinorrhea present, oropharynx clear, moist mucus membranes, no frontal or maxillary sinus tenderness Pulm: Normal work of breathing, normal phonation, clear to auscultation bilaterally, no wheezes, rales or rhonchi CV: Normal rate, regular rhythm, s1 and s2 distinct, no murmurs, clicks or rubs  Neuro: alert and oriented x 3, EOM's intact, no tremor Lymph: no cervical or tonsillar adenopathy Skin: warm and dry, no rashes or lesions on exposed skin, no cyanosis   No results found for this or any previous visit (from the past 72 hour(s)). No results found.    Assessment and Plan: 46 y.o. male with   1. Seasonal allergic rhinitis, unspecified trigger - cont Flonase. Reviewed proper technique and dosing - d/c Allegra and Zyrtec. Switching to Xyzal - montelukast (SINGULAIR) 10 MG tablet; Take 1 tablet (10 mg total) by mouth at bedtime.  Dispense: 90 tablet; Refill: 3 - ipratropium (ATROVENT) 0.06 % nasal spray; Place 1 spray into both nostrils 4 (four) times daily as needed.  Dispense: 15 mL; Refill: 3 - levocetirizine (  XYZAL) 5 MG tablet; Take 1 tablet (5 mg total) by mouth every evening.  Dispense: 90 tablet; Refill: 3  2. Cough -DDx includes PND/upper airway cough syndrome, post-viral cough syndrome, bronchospasm, asthma - recommend follow-up spirometry if cough persists   Patient education and anticipatory guidance given Patient agrees with treatment plan Follow-up as needed if symptoms worsen or fail to improve  Levonne Hubert PA-C

## 2017-02-11 ENCOUNTER — Other Ambulatory Visit: Payer: Self-pay | Admitting: Sports Medicine

## 2017-02-11 DIAGNOSIS — M25511 Pain in right shoulder: Principal | ICD-10-CM

## 2017-02-11 DIAGNOSIS — G8929 Other chronic pain: Secondary | ICD-10-CM

## 2017-04-01 ENCOUNTER — Ambulatory Visit (INDEPENDENT_AMBULATORY_CARE_PROVIDER_SITE_OTHER): Payer: BC Managed Care – PPO | Admitting: Sports Medicine

## 2017-04-01 ENCOUNTER — Encounter: Payer: Self-pay | Admitting: Sports Medicine

## 2017-04-01 ENCOUNTER — Encounter (INDEPENDENT_AMBULATORY_CARE_PROVIDER_SITE_OTHER): Payer: Self-pay

## 2017-04-01 VITALS — BP 119/77 | HR 57 | Resp 18 | Ht 69.0 in | Wt 221.1 lb

## 2017-04-01 DIAGNOSIS — Z9622 Myringotomy tube(s) status: Secondary | ICD-10-CM | POA: Diagnosis not present

## 2017-04-01 DIAGNOSIS — E669 Obesity, unspecified: Secondary | ICD-10-CM | POA: Diagnosis not present

## 2017-04-01 DIAGNOSIS — M19011 Primary osteoarthritis, right shoulder: Secondary | ICD-10-CM | POA: Diagnosis not present

## 2017-04-01 DIAGNOSIS — Z0000 Encounter for general adult medical examination without abnormal findings: Secondary | ICD-10-CM | POA: Diagnosis not present

## 2017-04-01 LAB — CBC
HCT: 45.7 % (ref 38.5–50.0)
Hemoglobin: 15.9 g/dL (ref 13.2–17.1)
MCH: 31.4 pg (ref 27.0–33.0)
MCHC: 34.8 g/dL (ref 32.0–36.0)
MCV: 90.1 fL (ref 80.0–100.0)
MPV: 11.2 fL (ref 7.5–12.5)
Platelets: 246 K/uL (ref 140–400)
RBC: 5.07 MIL/uL (ref 4.20–5.80)
RDW: 12.6 % (ref 11.0–15.0)
WBC: 5.6 K/uL (ref 3.8–10.8)

## 2017-04-01 LAB — TSH: TSH: 0.54 m[IU]/L (ref 0.40–4.50)

## 2017-04-01 MED ORDER — PHENTERMINE HCL 37.5 MG PO TABS: ORAL_TABLET | ORAL | 0 refills | Status: DC

## 2017-04-01 NOTE — Assessment & Plan Note (Signed)
Annual physical as above, routine blood work ordered. 

## 2017-04-01 NOTE — Assessment & Plan Note (Signed)
Now having a recurrence of pain, I'm going to do a glenohumeral injection again, aggressive formal physical therapy. Next line we discussed the limitations of surgery, and that it's unlikely to be necessary at this juncture.

## 2017-04-01 NOTE — Assessment & Plan Note (Signed)
Bilateral

## 2017-04-01 NOTE — Assessment & Plan Note (Signed)
Starting phentermine, return monthly for weight checks and refills. Nutrition referral. Exercise prescription given. Target weight is less than 165.

## 2017-04-01 NOTE — Progress Notes (Signed)
  Subjective:    CC: Annual physical exam   HPI:  Annual physical: Due for routine blood work.  Right shoulder pain: Known osteoarthritis, injection about 5 months ago provided good relief. He also did some physical therapy. Desires repeat interventional treatment, pain is moderate, persistent, localized at the joint line and worse with abduction and external rotation.  Obesity: Would like help with weight loss.  Past medical history:  Negative.  See flowsheet/record as well for more information.  Surgical history: Negative.  See flowsheet/record as well for more information.  Family history: Negative.  See flowsheet/record as well for more information.  Social history: Negative.  See flowsheet/record as well for more information.  Allergies, and medications have been entered into the medical record, reviewed, and no changes needed.    Review of Systems: No headache, visual changes, nausea, vomiting, diarrhea, constipation, dizziness, abdominal pain, skin rash, fevers, chills, night sweats, swollen lymph nodes, weight loss, chest pain, body aches, joint swelling, muscle aches, shortness of breath, mood changes, visual or auditory hallucinations.  Objective:    General: Well Developed, well nourished, and in no acute distress.  Neuro: Alert and oriented x3, extra-ocular muscles intact, sensation grossly intact. Cranial nerves II through XII are intact, motor, sensory, and coordinative functions are all intact. HEENT: Normocephalic, atraumatic, pupils equal round reactive to light, neck supple, no masses, no lymphadenopathy, thyroid nonpalpable. Oropharynx, nasopharynx, external ear canals are unremarkable. Bilateral tympanostomy tubes placed. Skin: Warm and dry, no rashes noted.  Cardiac: Regular rate and rhythm, no murmurs rubs or gallops.  Respiratory: Clear to auscultation bilaterally. Not using accessory muscles, speaking in full sentences.  Abdominal: Soft, nontender, nondistended,  positive bowel sounds, no masses, no organomegaly.  Musculoskeletal: Shoulder, elbow, wrist, hip, knee, ankle stable, and with full range of motion.  Procedure: Real-time Ultrasound Guided Injection of right glenohumeral joint Device: GE Logiq E  Verbal informed consent obtained.  Time-out conducted.  Noted no overlying erythema, induration, or other signs of local infection.  Skin prepped in a sterile fashion.  Local anesthesia: Topical Ethyl chloride.  With sterile technique and under real time ultrasound guidance:  Using a 22-gauge spinal needle advanced into the glenohumeral joint taking care to avoid the labrum, I then injected 1 mL kenalog 40, 2 mL lidocaine, 2 mL bupivacaine. Completed without difficulty  Pain immediately resolved suggesting accurate placement of the medication.  Advised to call if fevers/chills, erythema, induration, drainage, or persistent bleeding.  Images permanently stored and available for review in the ultrasound unit.  Impression: Technically successful ultrasound guided injection.  Impression and Recommendations:    The patient was counselled, risk factors were discussed, anticipatory guidance given.  Annual physical exam Annual physical as above, routine blood work ordered.  History of tympanostomy tube placement Bilateral.  Primary osteoarthritis of right shoulder Now having a recurrence of pain, I'm going to do a glenohumeral injection again, aggressive formal physical therapy. Next line we discussed the limitations of surgery, and that it's unlikely to be necessary at this juncture.  Obesity (BMI 30-39.9) Starting phentermine, return monthly for weight checks and refills. Nutrition referral. Exercise prescription given. Target weight is less than 165.

## 2017-04-02 LAB — LIPID PANEL W/REFLEX DIRECT LDL
Cholesterol: 156 mg/dL (ref <200)
HDL: 54 mg/dL (ref >40)
LDL-Cholesterol: 85 mg/dL
Non-HDL Cholesterol (Calc): 102 mg/dL (ref <130)
Total CHOL/HDL Ratio: 2.9 Ratio (ref <5.0)
Triglycerides: 77 mg/dL (ref <150)

## 2017-04-02 LAB — COMPREHENSIVE METABOLIC PANEL WITH GFR
ALT: 16 U/L (ref 9–46)
Alkaline Phosphatase: 58 U/L (ref 40–115)
BUN: 13 mg/dL (ref 7–25)
Chloride: 106 mmol/L (ref 98–110)
Creat: 0.91 mg/dL (ref 0.60–1.35)
Glucose, Bld: 82 mg/dL (ref 65–99)
Potassium: 4.4 mmol/L (ref 3.5–5.3)
Sodium: 141 mmol/L (ref 135–146)
Total Protein: 6.3 g/dL (ref 6.1–8.1)

## 2017-04-02 LAB — COMPREHENSIVE METABOLIC PANEL
AST: 18 U/L (ref 10–40)
Albumin: 4.4 g/dL (ref 3.6–5.1)
CO2: 20 mmol/L (ref 20–31)
Calcium: 9.3 mg/dL (ref 8.6–10.3)
Total Bilirubin: 0.6 mg/dL (ref 0.2–1.2)

## 2017-04-02 LAB — HEMOGLOBIN A1C
Hgb A1c MFr Bld: 4.9 % (ref <5.7)
Mean Plasma Glucose: 94 mg/dL

## 2017-04-02 LAB — VITAMIN D 25 HYDROXY (VIT D DEFICIENCY, FRACTURES): Vit D, 25-Hydroxy: 40 ng/mL (ref 30–100)

## 2017-04-24 ENCOUNTER — Other Ambulatory Visit: Payer: Self-pay | Admitting: Physician Assistant

## 2017-04-24 DIAGNOSIS — J302 Other seasonal allergic rhinitis: Secondary | ICD-10-CM

## 2017-04-30 ENCOUNTER — Encounter: Payer: Self-pay | Admitting: Sports Medicine

## 2017-04-30 ENCOUNTER — Ambulatory Visit (INDEPENDENT_AMBULATORY_CARE_PROVIDER_SITE_OTHER): Payer: BC Managed Care – PPO | Admitting: Sports Medicine

## 2017-04-30 DIAGNOSIS — M19011 Primary osteoarthritis, right shoulder: Secondary | ICD-10-CM

## 2017-04-30 DIAGNOSIS — E669 Obesity, unspecified: Secondary | ICD-10-CM

## 2017-04-30 DIAGNOSIS — Z23 Encounter for immunization: Secondary | ICD-10-CM

## 2017-04-30 MED ORDER — PHENTERMINE HCL 37.5 MG PO TABS: ORAL_TABLET | ORAL | 0 refills | Status: DC

## 2017-04-30 NOTE — Assessment & Plan Note (Signed)
Glenohumeral injection at the last visit provided fantastic relief, pain-free.

## 2017-04-30 NOTE — Assessment & Plan Note (Signed)
Good weight loss after the first month, refilling.

## 2017-04-30 NOTE — Progress Notes (Signed)
  Subjective:    CC: follow-up  HPI: Obesity: 10 pound weight loss after the first month.  Right shoulder pain: Doing well after glenohumeral injection at the last visit.  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 Things seem to be going well now.  Past medical history:  Negative.  See flowsheet/record as well for more information.  Surgical history: Negative.  See flowsheet/record as well for more information.  Family history: Negative.  See flowsheet/record as well for more information.  Social history: Negative.  See flowsheet/record as well for more information.  Allergies, and medications have been entered into the medical record, reviewed, and no changes needed.   Review of Systems: No fevers, chills, night sweats, weight loss, chest pain, or shortness of breath.   Objective:    General: Well Developed, well nourished, and in no acute distress.  Neuro: Alert and oriented x3, extra-ocular muscles intact, sensation grossly intact.  HEENT: Normocephalic, atraumatic, pupils equal round reactive to light, neck supple, no masses, no lymphadenopathy, thyroid nonpalpable.  Skin: Warm and dry, no rashes. Cardiac: Regular rate and rhythm, no murmurs rubs or gallops, no lower extremity edema.  Respiratory: Clear to auscultation bilaterally. Not using accessory muscles, speaking in full sentences.  Impression and Recommendations:    Obesity (BMI 30-39.9) Good weight loss after the first month, refilling.  Primary osteoarthritis of right shoulder Glenohumeral injection at the last visit provided fantastic relief, pain-free.

## 2017-05-09 ENCOUNTER — Other Ambulatory Visit: Payer: Self-pay | Admitting: Physician Assistant

## 2017-05-09 DIAGNOSIS — J302 Other seasonal allergic rhinitis: Secondary | ICD-10-CM

## 2017-05-14 ENCOUNTER — Ambulatory Visit (INDEPENDENT_AMBULATORY_CARE_PROVIDER_SITE_OTHER): Payer: BC Managed Care – PPO | Admitting: Physician Assistant

## 2017-05-14 ENCOUNTER — Encounter: Payer: Self-pay | Admitting: Physician Assistant

## 2017-05-14 VITALS — BP 100/68 | HR 82 | Temp 98.2°F | Wt 213.0 lb

## 2017-05-14 DIAGNOSIS — J208 Acute bronchitis due to other specified organisms: Secondary | ICD-10-CM

## 2017-05-14 MED ORDER — ALBUTEROL SULFATE HFA 108 (90 BASE) MCG/ACT IN AERS: 1.0000 | INHALATION_SPRAY | Freq: Four times a day (QID) | RESPIRATORY_TRACT | 0 refills | Status: DC | PRN

## 2017-05-14 MED ORDER — ALBUTEROL SULFATE (2.5 MG/3ML) 0.083% IN NEBU
2.5000 mg | INHALATION_SOLUTION | Freq: Once | RESPIRATORY_TRACT | Status: AC
  Administered 2017-05-14: 2.5 mg via RESPIRATORY_TRACT

## 2017-05-14 MED ORDER — BENZONATATE 200 MG PO CAPS: 200.0000 mg | ORAL_CAPSULE | Freq: Three times a day (TID) | ORAL | 0 refills | Status: DC | PRN

## 2017-05-14 NOTE — Progress Notes (Signed)
HPI:                                                                Jimmy Garcia is a 46 y.o. male who presents to [facility]: Primary Care Sports Medicine today for cough and cold symptoms  Cough  This is a new problem. The current episode started in the past 7 days (5 days). The problem has been unchanged. The problem occurs every few minutes. The cough is non-productive. Associated symptoms include nasal congestion, rhinorrhea and a sore throat. Pertinent negatives include no fever, shortness of breath or wheezing. Risk factors: sick contacts at work. He has tried OTC cough suppressant (Dayquil, Nyquil, ) for the symptoms. The treatment provided mild relief. His past medical history is significant for environmental allergies. There is no history of asthma or COPD.     Past Medical History:  Diagnosis Date  . Allergy    Past Surgical History:  Procedure Laterality Date  . ANTERIOR CRUCIATE LIGAMENT REPAIR Right    01/2015   Social History  Substance Use Topics  . Smoking status: Never Smoker  . Smokeless tobacco: Never Used  . Alcohol use No   family history includes Cancer in his paternal grandfather.  ROS: negative except as noted in the HPI  Medications: Current Outpatient Prescriptions  Medication Sig Dispense Refill  . FLUoxetine (PROZAC) 20 MG capsule TAKE 1 CAPSULE BY MOUTH EVERY DAY    . ipratropium (ATROVENT) 0.06 % nasal spray PLACE 1 SPRAY INTO BOTH NOSTRILS 4 (FOUR) TIMES DAILY AS NEEDED. 15 mL 3  . ipratropium (ATROVENT) 0.06 % nasal spray PLACE 1 SPRAY INTO BOTH NOSTRILS 4 (FOUR) TIMES DAILY AS NEEDED. 15 mL 3  . levocetirizine (XYZAL) 5 MG tablet Take 1 tablet (5 mg total) by mouth every evening. 90 tablet 3  . meloxicam (MOBIC) 15 MG tablet ONE TABLET BY MOUTH EVERY MORNING WITH BREAKFAST FOR 2 WEEKS, THEN DAILY AS NEEDED FOR PAIN. 30 tablet 3  . montelukast (SINGULAIR) 10 MG tablet Take 1 tablet (10 mg total) by mouth at bedtime. 90  tablet 3  . Multiple Vitamins-Minerals (MULTIVITAMIN WITH MINERALS) tablet Take 1 tablet by mouth daily.    . phentermine (ADIPEX-P) 37.5 MG tablet One tab by mouth qAM 30 tablet 0  . XTAMPZA ER 18 MG C12A TAKE ONE CAPSULE EVERY 12 HOURS  0   No current facility-administered medications for this visit.    Allergies  Allergen Reactions  . Sulfa Antibiotics        Objective:  BP 100/68   Pulse 82   Temp 98.2 F (36.8 C) (Oral)   Wt 213 lb (96.6 kg)   BMI 31.45 kg/m  Gen: well-groomed, cooperative, not ill-appearing, no distress HEENT: normal conjunctiva, bilateral tympanostomy tubes in place, canals and TMs without, erythema nasal mucosa edematous, oropharynx with erythema, no swelling or tonsillar exudates, moist mucus membranes, no frontal or maxillary sinus tenderness, neck supple, trachea midline Pulm: Normal work of breathing, normal phonation, expiratory wheeze at the RML, otherwise clear to ausculation CV: Normal rate, regular rhythm, s1 and s2 distinct, no murmurs, clicks or rubs  Neuro: alert and oriented x 3, no tremor MSK: extremities atraumatic, normal gait and station Lymph: no cervical or tonsillar adenopathy Skin:  warm, dry, intact; no rashes on exposed skin, no cyanosis  No results found for this or any previous visit (from the past 72 hour(s)). No results found.    Assessment and Plan: 46 y.o. male with   1. Acute viral bronchitis - vitals reviewed and normal. No evidence of respiratory distress. No indication for CXR at this time - albuterol neb given in office - symptomatic management - albuterol (PROVENTIL) (2.5 MG/3ML) 0.083% nebulizer solution 2.5 mg; Take 3 mLs (2.5 mg total) by nebulization once. - benzonatate (TESSALON) 200 MG capsule; Take 1 capsule (200 mg total) by mouth 3 (three) times daily as needed for cough.  Dispense: 30 capsule; Refill: 0 - albuterol (PROVENTIL HFA;VENTOLIN HFA) 108 (90 Base) MCG/ACT inhaler; Inhale 1-2 puffs into the  lungs every 6 (six) hours as needed for wheezing or shortness of breath.  Dispense: 1 Inhaler; Refill: 0    Patient education and anticipatory guidance given Patient agrees with treatment plan Follow-up as needed if symptoms worsen or fail to improve  Jimmy Hubertharley E. Aleka Twitty PA-C

## 2017-05-14 NOTE — Patient Instructions (Signed)

## 2017-05-23 ENCOUNTER — Ambulatory Visit (INDEPENDENT_AMBULATORY_CARE_PROVIDER_SITE_OTHER): Payer: BC Managed Care – PPO | Admitting: Sports Medicine

## 2017-05-23 DIAGNOSIS — R059 Cough, unspecified: Secondary | ICD-10-CM | POA: Insufficient documentation

## 2017-05-23 DIAGNOSIS — R05 Cough: Secondary | ICD-10-CM | POA: Diagnosis not present

## 2017-05-23 MED ORDER — AZITHROMYCIN 250 MG PO TABS: ORAL_TABLET | ORAL | 0 refills | Status: DC

## 2017-05-23 MED ORDER — HYDROCOD POLST-CPM POLST ER 10-8 MG/5ML PO SUER: 5.0000 mL | Freq: Two times a day (BID) | ORAL | 0 refills | Status: DC | PRN

## 2017-05-23 NOTE — Assessment & Plan Note (Signed)
Cough is been present long enough for we are going to do antibiotics, Tussionex. Return as needed. Chest auscultation was clear.

## 2017-05-23 NOTE — Progress Notes (Signed)
  Subjective:    CC: Cough  HPI: Persistent cough now for 3 weeks, minimally productive. No constitutional symptoms.  Past medical history:  Negative.  See flowsheet/record as well for more information.  Surgical history: Negative.  See flowsheet/record as well for more information.  Family history: Negative.  See flowsheet/record as well for more information.  Social history: Negative.  See flowsheet/record as well for more information.  Allergies, and medications have been entered into the medical record, reviewed, and no changes needed.   Review of Systems: No fevers, chills, night sweats, weight loss, chest pain, or shortness of breath.   Objective:    General: Well Developed, well nourished, and in no acute distress.  Neuro: Alert and oriented x3, extra-ocular muscles intact, sensation grossly intact.  HEENT: Normocephalic, atraumatic, pupils equal round reactive to light, neck supple, no masses, no lymphadenopathy, thyroid nonpalpable.  Skin: Warm and dry, no rashes. Cardiac: Regular rate and rhythm, no murmurs rubs or gallops, no lower extremity edema.  Respiratory: Clear to auscultation bilaterally. Not using accessory muscles, speaking in full sentences. Coughing in the exam room  Impression and Recommendations:    Coughing Cough is been present long enough for we are going to do antibiotics, Tussionex. Return as needed. Chest auscultation was clear.  I spent 25 minutes with this patient, greater than 50% was face-to-face time counseling regarding the above diagnoses ___________________________________________ Ihor Austin. Benjamin Stain, M.D., ABFM., CAQSM. Primary Care and Sports Medicine [place] [facility]  Adjunct Instructor of Family Medicine  [employer]

## 2017-05-29 ENCOUNTER — Ambulatory Visit (INDEPENDENT_AMBULATORY_CARE_PROVIDER_SITE_OTHER): Payer: BC Managed Care – PPO | Admitting: Sports Medicine

## 2017-05-29 ENCOUNTER — Encounter: Payer: Self-pay | Admitting: Sports Medicine

## 2017-05-29 DIAGNOSIS — E669 Obesity, unspecified: Secondary | ICD-10-CM

## 2017-05-29 MED ORDER — PHENTERMINE HCL 37.5 MG PO TABS: ORAL_TABLET | ORAL | 0 refills | Status: DC

## 2017-05-29 NOTE — Assessment & Plan Note (Signed)
One-pound weight loss over the past month, he did have an interspersed bronchitis. Entering the third month, refilling phentermine, if less than 5 pounds we will add Topamax.

## 2017-05-29 NOTE — Progress Notes (Signed)
  Subjective:    CC: weight check  HPI: One-pound weight loss, he did get sick in between. No adverse effects.  Past medical history:  Negative.  See flowsheet/record as well for more information.  Surgical history: Negative.  See flowsheet/record as well for more information.  Family history: Negative.  See flowsheet/record as well for more information.  Social history: Negative.  See flowsheet/record as well for more information.  Allergies, and medications have been entered into the medical record, reviewed, and no changes needed.   Review of Systems: No fevers, chills, night sweats, weight loss, chest pain, or shortness of breath.   Objective:    General: Well Developed, well nourished, and in no acute distress.  Neuro: Alert and oriented x3, extra-ocular muscles intact, sensation grossly intact.  HEENT: Normocephalic, atraumatic, pupils equal round reactive to light, neck supple, no masses, no lymphadenopathy, thyroid nonpalpable.  Skin: Warm and dry, no rashes. Cardiac: Regular rate and rhythm, no murmurs rubs or gallops, no lower extremity edema.  Respiratory: Clear to auscultation bilaterally. Not using accessory muscles, speaking in full sentences.  Impression and Recommendations:    Obesity (BMI 30-39.9) One-pound weight loss over the past month, he did have an interspersed bronchitis. Entering the third month, refilling phentermine, if less than 5 pounds we will add Topamax.  ___________________________________________ Ihor Austin. Benjamin Stain, M.D., ABFM., CAQSM. Primary Care and Sports Medicine [place] [facility]  Adjunct Instructor of Family Medicine  [employer]

## 2017-06-09 ENCOUNTER — Other Ambulatory Visit: Payer: Self-pay

## 2017-06-09 DIAGNOSIS — G8929 Other chronic pain: Secondary | ICD-10-CM

## 2017-06-09 DIAGNOSIS — M25511 Pain in right shoulder: Principal | ICD-10-CM

## 2017-06-09 MED ORDER — MELOXICAM 15 MG PO TABS: ORAL_TABLET | ORAL | 1 refills | Status: DC

## 2017-06-11 ENCOUNTER — Other Ambulatory Visit: Payer: Self-pay | Admitting: Physician Assistant

## 2017-06-11 DIAGNOSIS — J208 Acute bronchitis due to other specified organisms: Secondary | ICD-10-CM

## 2017-06-25 ENCOUNTER — Encounter: Payer: Self-pay | Admitting: Sports Medicine

## 2017-06-25 ENCOUNTER — Ambulatory Visit (INDEPENDENT_AMBULATORY_CARE_PROVIDER_SITE_OTHER): Payer: BC Managed Care – PPO | Admitting: Sports Medicine

## 2017-06-25 DIAGNOSIS — E669 Obesity, unspecified: Secondary | ICD-10-CM

## 2017-06-25 MED ORDER — TOPIRAMATE 50 MG PO TABS: ORAL_TABLET | ORAL | 0 refills | Status: DC

## 2017-06-25 MED ORDER — PHENTERMINE HCL 37.5 MG PO TABS: ORAL_TABLET | ORAL | 0 refills | Status: DC

## 2017-06-25 NOTE — Progress Notes (Signed)
  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  Past medical history:  Negative.  See flowsheet/record as well for more information.  Surgical history: Negative.  See flowsheet/record as well for more information.  Family history: Negative.  See flowsheet/record as well for more information.  Social history: Negative.  See flowsheet/record as well for more information.  Allergies, and medications have been entered into the medical record, reviewed, and no changes needed.   Review of Systems: No fevers, chills, night sweats, weight loss, chest pain, or shortness of breath.   Objective:    General: Well Developed, well nourished, and in no acute distress.  Neuro: Alert and oriented x3, extra-ocular muscles intact, sensation grossly intact.  HEENT: Normocephalic, atraumatic, pupils equal round reactive to light, neck supple, no masses, no lymphadenopathy, thyroid nonpalpable.  Skin: Warm and dry, no rashes. Cardiac: Regular rate and rhythm, no murmurs rubs or gallops, no lower extremity edema.  Respiratory: Clear to auscultation bilaterally. Not using accessory muscles, speaking in full sentences.  Impression and Recommendations:    Obesity (BMI 30-39.9) Entering the fourth month, less than 5 pound weight loss over the last month so refilling phentermine and adding Topamax. Return in 1 month.  I spent 25 minutes with this patient, greater than 50% was face-to-face time counseling regarding the above diagnoses ___________________________________________ Ihor Austinhomas J. Benjamin Stainhekkekandam, M.D., ABFM., CAQSM. Primary Care and Sports Medicine [place] [facility]  Adjunct Instructor of Family Medicine  [employer]

## 2017-06-25 NOTE — Assessment & Plan Note (Signed)
Entering the fourth month, less than 5 pound weight loss over the last month so refilling phentermine and adding Topamax. Return in 1 month.

## 2017-06-26 ENCOUNTER — Ambulatory Visit: Payer: BC Managed Care – PPO | Admitting: Sports Medicine

## 2017-07-07 ENCOUNTER — Telehealth: Payer: Self-pay | Admitting: Sports Medicine

## 2017-07-07 MED ORDER — AZITHROMYCIN 250 MG PO TABS: ORAL_TABLET | ORAL | 0 refills | Status: DC

## 2017-07-07 NOTE — Telephone Encounter (Signed)
I think at this point it is appropriate, adding azithromycin.

## 2017-07-07 NOTE — Telephone Encounter (Addendum)
Pt called. His cough has returned. He thinks if he would be fine if he could get an  antiobiotic to  take along with the delsym. He is in a wedding Friday and Saturday this week and is hoping he can get cough knocked out by then.  Thank you.

## 2017-07-08 NOTE — Telephone Encounter (Signed)
Thanks Kelsi! °

## 2017-07-08 NOTE — Telephone Encounter (Signed)
Left VM advising Pt.  

## 2017-07-22 ENCOUNTER — Other Ambulatory Visit: Payer: Self-pay | Admitting: Sports Medicine

## 2017-07-22 DIAGNOSIS — E669 Obesity, unspecified: Secondary | ICD-10-CM

## 2017-07-23 ENCOUNTER — Encounter: Payer: Self-pay | Admitting: Sports Medicine

## 2017-07-23 ENCOUNTER — Ambulatory Visit: Payer: BC Managed Care – PPO | Admitting: Sports Medicine

## 2017-07-23 DIAGNOSIS — E669 Obesity, unspecified: Secondary | ICD-10-CM | POA: Diagnosis not present

## 2017-07-23 MED ORDER — TOPIRAMATE 50 MG PO TABS: 50.0000 mg | ORAL_TABLET | Freq: Two times a day (BID) | ORAL | 0 refills | Status: DC

## 2017-07-23 MED ORDER — PHENTERMINE HCL 37.5 MG PO TABS: ORAL_TABLET | ORAL | 0 refills | Status: DC

## 2017-07-23 NOTE — Progress Notes (Signed)
  Subjective:    CC: Weight check  HPI: Additional 8 pounds after the addition of Topamax, no adverse effects.  Past medical history:  Negative.  See flowsheet/record as well for more information.  Surgical history: Negative.  See flowsheet/record as well for more information.  Family history: Negative.  See flowsheet/record as well for more information.  Social history: Negative.  See flowsheet/record as well for more information.  Allergies, and medications have been entered into the medical record, reviewed, and no changes needed.   Review of Systems: No fevers, chills, night sweats, weight loss, chest pain, or shortness of breath.   Objective:    General: Well Developed, well nourished, and in no acute distress.  Neuro: Alert and oriented x3, extra-ocular muscles intact, sensation grossly intact.  HEENT: Normocephalic, atraumatic, pupils equal round reactive to light, neck supple, no masses, no lymphadenopathy, thyroid nonpalpable.  Skin: Warm and dry, no rashes. Cardiac: Regular rate and rhythm, no murmurs rubs or gallops, no lower extremity edema.  Respiratory: Clear to auscultation bilaterally. Not using accessory muscles, speaking in full sentences.  Impression and Recommendations:    Obesity (BMI 30-39.9) Nearly 10 pound weight loss after the fourth month, we added Topamax last time.  Entering the fifth month, doubling Topamax.  ___________________________________________ Ihor Austinhomas J. Benjamin Stainhekkekandam, M.D., ABFM., CAQSM. Primary Care and Sports Medicine [place] [facility]  Adjunct Instructor of Family Medicine  [employer]

## 2017-07-23 NOTE — Assessment & Plan Note (Signed)
Nearly 10 pound weight loss after the fourth month, we added Topamax last time.  Entering the fifth month, doubling Topamax.

## 2017-08-21 ENCOUNTER — Ambulatory Visit: Payer: BC Managed Care – PPO | Admitting: Sports Medicine

## 2017-08-22 ENCOUNTER — Ambulatory Visit (INDEPENDENT_AMBULATORY_CARE_PROVIDER_SITE_OTHER): Payer: BC Managed Care – PPO | Admitting: Sports Medicine

## 2017-08-22 ENCOUNTER — Encounter: Payer: Self-pay | Admitting: Sports Medicine

## 2017-08-22 DIAGNOSIS — E669 Obesity, unspecified: Secondary | ICD-10-CM | POA: Diagnosis not present

## 2017-08-22 MED ORDER — PHENTERMINE HCL 37.5 MG PO TABS: ORAL_TABLET | ORAL | 0 refills | Status: DC

## 2017-08-22 MED ORDER — TOPIRAMATE 50 MG PO TABS: 50.0000 mg | ORAL_TABLET | Freq: Two times a day (BID) | ORAL | 0 refills | Status: DC

## 2017-08-22 NOTE — Assessment & Plan Note (Signed)
Testicle overall weight loss. He is about the same as last month. Refilling phentermine, entering the 4860-month, refilling Topamax. Return in 1 month, we will drop him to a half tab and give him 2265-month supply at that time.

## 2017-08-22 NOTE — Progress Notes (Signed)
  Subjective:    CC: Weight check  HPI: Obesity: Is about the same weight as last visit, he has had some issues with dietary indiscretions during the holidays.  He is tolerating Topamax twice a day and phentermine well.  We are entering the sixth month.  Past medical history:  Negative.  See flowsheet/record as well for more information.  Surgical history: Negative.  See flowsheet/record as well for more information.  Family history: Negative.  See flowsheet/record as well for more information.  Social history: Negative.  See flowsheet/record as well for more information.  Allergies, and medications have been entered into the medical record, reviewed, and no changes needed.   (To billers/coders, pertinent past medical, social, surgical, family history can be found in problem list, if problem list is marked as reviewed then this indicates that past medical, social, surgical, family history was also reviewed)  Review of Systems: No fevers, chills, night sweats, weight loss, chest pain, or shortness of breath.   Objective:    General: Well Developed, well nourished, and in no acute distress.  Neuro: Alert and oriented x3, extra-ocular muscles intact, sensation grossly intact.  HEENT: Normocephalic, atraumatic, pupils equal round reactive to light, neck supple, no masses, no lymphadenopathy, thyroid nonpalpable.  Skin: Warm and dry, no rashes. Cardiac: Regular rate and rhythm, no murmurs rubs or gallops, no lower extremity edema.  Respiratory: Clear to auscultation bilaterally. Not using accessory muscles, speaking in full sentences.  Impression and Recommendations:    Obesity (BMI 30-39.9) Testicle overall weight loss. He is about the same as last month. Refilling phentermine, entering the 7125-month, refilling Topamax. Return in 1 month, we will drop him to a half tab and give him 857-month supply at that time. ___________________________________________ Ihor Austinhomas J. Benjamin Stainhekkekandam, M.D.,  ABFM., CAQSM. Primary Care and Sports Medicine [facility]  Adjunct Instructor of Family Medicine  [employer]

## 2017-09-19 ENCOUNTER — Other Ambulatory Visit: Payer: Self-pay | Admitting: Sports Medicine

## 2017-09-19 ENCOUNTER — Ambulatory Visit: Payer: BC Managed Care – PPO | Admitting: Sports Medicine

## 2017-09-19 DIAGNOSIS — E669 Obesity, unspecified: Secondary | ICD-10-CM

## 2017-09-22 ENCOUNTER — Encounter: Payer: Self-pay | Admitting: Sports Medicine

## 2017-09-22 ENCOUNTER — Ambulatory Visit: Payer: BC Managed Care – PPO | Admitting: Sports Medicine

## 2017-09-22 DIAGNOSIS — E669 Obesity, unspecified: Secondary | ICD-10-CM | POA: Diagnosis not present

## 2017-09-22 MED ORDER — TOPIRAMATE 50 MG PO TABS: 50.0000 mg | ORAL_TABLET | Freq: Two times a day (BID) | ORAL | 0 refills | Status: DC

## 2017-09-22 MED ORDER — PHENTERMINE HCL 37.5 MG PO TABS: 18.7500 mg | ORAL_TABLET | Freq: Every day | ORAL | 0 refills | Status: DC

## 2017-09-22 NOTE — Assessment & Plan Note (Signed)
We have at the 7521-month mark, he has lost a few more pounds. At this point I am going to taper him down off of phentermine.   Refilling Topamax as well, 7431-month supply of half dose phentermine given.

## 2017-09-22 NOTE — Progress Notes (Signed)
  Subjective:    CC: Weight check  HPI: Jimmy Garcia returns, we have now finished 6 months of phentermine, and he is ready to taper off of it.  He started it over 224 pounds, and he is now 198.  Feels much better, clothes fit better, happy with how things are going.  Doing phentermine and Topamax twice a day.  I reviewed the past medical history, family history, social history, surgical history, and allergies today and no changes were needed.  Please see the problem list section below in epic for further details.  Past Medical History: Past Medical History:  Diagnosis Date  . Allergy    Past Surgical History: Past Surgical History:  Procedure Laterality Date  . ANTERIOR CRUCIATE LIGAMENT REPAIR Right    01/2015   Social History: Social History   Socioeconomic History  . Marital status: Single    Spouse name: Doristine MangoJennifer Tilley  . Number of children: None  . Years of education: None  . Highest education level: None  Social Needs  . Financial resource strain: None  . Food insecurity - worry: None  . Food insecurity - inability: None  . Transportation needs - medical: None  . Transportation needs - non-medical: None  Occupational History  . Occupation: Education  Tobacco Use  . Smoking status: Never Smoker  . Smokeless tobacco: Never Used  Substance and Sexual Activity  . Alcohol use: No  . Drug use: No  . Sexual activity: Yes    Partners: Female  Other Topics Concern  . None  Social History Narrative  . None   Family History: Family History  Problem Relation Age of Onset  . Cancer Paternal Grandfather    Allergies: Allergies  Allergen Reactions  . Sulfa Antibiotics    Medications: See med rec.  Review of Systems: No fevers, chills, night sweats, weight loss, chest pain, or shortness of breath.   Objective:    General: Well Developed, well nourished, and in no acute distress.  Neuro: Alert and oriented x3, extra-ocular muscles intact, sensation grossly intact.    HEENT: Normocephalic, atraumatic, pupils equal round reactive to light, neck supple, no masses, no lymphadenopathy, thyroid nonpalpable.  Skin: Warm and dry, no rashes. Cardiac: Regular rate and rhythm, no murmurs rubs or gallops, no lower extremity edema.  Respiratory: Clear to auscultation bilaterally. Not using accessory muscles, speaking in full sentences.  Impression and Recommendations:    Obesity (BMI 30-39.9) We have at the 45334-month mark, he has lost a few more pounds. At this point I am going to taper him down off of phentermine.   Refilling Topamax as well, 5034-month supply of half dose phentermine given. ___________________________________________ Ihor Austinhomas J. Benjamin Stainhekkekandam, M.D., ABFM., CAQSM. Primary Care and Sports Medicine [facility]  Adjunct Instructor of Family Medicine  [employer]

## 2017-09-25 ENCOUNTER — Other Ambulatory Visit: Payer: Self-pay | Admitting: Sports Medicine

## 2017-09-25 DIAGNOSIS — E669 Obesity, unspecified: Secondary | ICD-10-CM

## 2017-09-25 MED ORDER — PHENTERMINE HCL 37.5 MG PO TABS: 18.7500 mg | ORAL_TABLET | Freq: Every day | ORAL | 0 refills | Status: DC

## 2017-11-03 ENCOUNTER — Other Ambulatory Visit: Payer: Self-pay | Admitting: Sports Medicine

## 2017-11-03 DIAGNOSIS — G8929 Other chronic pain: Secondary | ICD-10-CM

## 2017-11-03 DIAGNOSIS — M25511 Pain in right shoulder: Principal | ICD-10-CM

## 2017-12-08 ENCOUNTER — Other Ambulatory Visit: Payer: Self-pay | Admitting: Physician Assistant

## 2017-12-08 DIAGNOSIS — J302 Other seasonal allergic rhinitis: Secondary | ICD-10-CM

## 2017-12-12 ENCOUNTER — Other Ambulatory Visit: Payer: Self-pay | Admitting: Physician Assistant

## 2017-12-12 DIAGNOSIS — J302 Other seasonal allergic rhinitis: Secondary | ICD-10-CM

## 2017-12-16 ENCOUNTER — Other Ambulatory Visit: Payer: Self-pay | Admitting: Sports Medicine

## 2017-12-16 DIAGNOSIS — E669 Obesity, unspecified: Secondary | ICD-10-CM

## 2017-12-22 ENCOUNTER — Ambulatory Visit: Payer: BC Managed Care – PPO | Admitting: Sports Medicine

## 2017-12-22 ENCOUNTER — Encounter: Payer: Self-pay | Admitting: Sports Medicine

## 2017-12-22 DIAGNOSIS — E669 Obesity, unspecified: Secondary | ICD-10-CM

## 2017-12-22 MED ORDER — ACARBOSE 50 MG PO TABS: 50.0000 mg | ORAL_TABLET | Freq: Three times a day (TID) | ORAL | 3 refills | Status: DC

## 2017-12-22 NOTE — Progress Notes (Signed)
  Subjective:    CC: Follow-up  HPI: Obesity: Jimmy Garcia has maintained his weight as we have been down tapering his phentermine, agreeable to come off.  Currently doing Topamax for now.  He would like to lose a bit more weight.  Goal is around 175.  I reviewed the past medical history, family history, social history, surgical history, and allergies today and no changes were needed.  Please see the problem list section below in epic for further details.  Past Medical History: Past Medical History:  Diagnosis Date  . Allergy    Past Surgical History: Past Surgical History:  Procedure Laterality Date  . ANTERIOR CRUCIATE LIGAMENT REPAIR Right    01/2015   Social History: Social History   Socioeconomic History  . Marital status: Single    Spouse name: Doristine MangoJennifer Duplechain  . Number of children: Not on file  . Years of education: Not on file  . Highest education level: Not on file  Occupational History  . Occupation: Education  Social Needs  . Financial resource strain: Not on file  . Food insecurity:    Worry: Not on file    Inability: Not on file  . Transportation needs:    Medical: Not on file    Non-medical: Not on file  Tobacco Use  . Smoking status: Never Smoker  . Smokeless tobacco: Never Used  Substance and Sexual Activity  . Alcohol use: No  . Drug use: No  . Sexual activity: Yes    Partners: Female  Lifestyle  . Physical activity:    Days per week: Not on file    Minutes per session: Not on file  . Stress: Not on file  Relationships  . Social connections:    Talks on phone: Not on file    Gets together: Not on file    Attends religious service: Not on file    Active member of club or organization: Not on file    Attends meetings of clubs or organizations: Not on file    Relationship status: Not on file  Other Topics Concern  . Not on file  Social History Narrative  . Not on file   Family History: Family History  Problem Relation Age of Onset  . Cancer  Paternal Grandfather    Allergies: Allergies  Allergen Reactions  . Sulfa Antibiotics    Medications: See med rec.  Review of Systems: No fevers, chills, night sweats, weight loss, chest pain, or shortness of breath.   Objective:    General: Well Developed, well nourished, and in no acute distress.  Neuro: Alert and oriented x3, extra-ocular muscles intact, sensation grossly intact.  HEENT: Normocephalic, atraumatic, pupils equal round reactive to light, neck supple, no masses, no lymphadenopathy, thyroid nonpalpable.  Skin: Warm and dry, no rashes. Cardiac: Regular rate and rhythm, no murmurs rubs or gallops, no lower extremity edema.  Respiratory: Clear to auscultation bilaterally. Not using accessory muscles, speaking in full sentences.  Impression and Recommendations:    Obesity (BMI 30-39.9) Has now finished 3 months of half dose phentermine. Has lost between 20 and 30 pounds. Refilling Topamax to be used twice a day, and switching to acarbose. Return in 1 month to evaluate acarbose tolerance and then we will increase to the full dose. He can then do this long-term. ___________________________________________ Ihor Austinhomas J. Benjamin Stainhekkekandam, M.D., ABFM., CAQSM. Primary Care and Sports Medicine [facility]  Adjunct Instructor of Family Medicine  [employer]

## 2017-12-22 NOTE — Assessment & Plan Note (Signed)
Has now finished 3 months of half dose phentermine. Has lost between 20 and 30 pounds. Refilling Topamax to be used twice a day, and switching to acarbose. Return in 1 month to evaluate acarbose tolerance and then we will increase to the full dose. He can then do this long-term.

## 2018-01-19 ENCOUNTER — Encounter: Payer: Self-pay | Admitting: Sports Medicine

## 2018-01-19 ENCOUNTER — Ambulatory Visit: Payer: BC Managed Care – PPO | Admitting: Sports Medicine

## 2018-01-19 DIAGNOSIS — E669 Obesity, unspecified: Secondary | ICD-10-CM | POA: Diagnosis not present

## 2018-01-19 MED ORDER — ACARBOSE 100 MG PO TABS: 100.0000 mg | ORAL_TABLET | Freq: Three times a day (TID) | ORAL | 1 refills | Status: DC

## 2018-01-19 NOTE — Progress Notes (Signed)
  Subjective:    CC: Weight check  HPI: Jimmy Garcia returns, he is off of phentermine, only on Topamax twice a day and we started acarbose at the last visit, 6 more pounds down, no adverse effects.  I reviewed the past medical history, family history, social history, surgical history, and allergies today and no changes were needed.  Please see the problem list section below in epic for further details.  Past Medical History: Past Medical History:  Diagnosis Date  . Allergy    Past Surgical History: Past Surgical History:  Procedure Laterality Date  . ANTERIOR CRUCIATE LIGAMENT REPAIR Right    01/2015   Social History: Social History   Socioeconomic History  . Marital status: Single    Spouse name: Jimmy Garcia  . Number of children: Not on file  . Years of education: Not on file  . Highest education level: Not on file  Occupational History  . Occupation: Education  Social Needs  . Financial resource strain: Not on file  . Food insecurity:    Worry: Not on file    Inability: Not on file  . Transportation needs:    Medical: Not on file    Non-medical: Not on file  Tobacco Use  . Smoking status: Never Smoker  . Smokeless tobacco: Never Used  Substance and Sexual Activity  . Alcohol use: No  . Drug use: No  . Sexual activity: Yes    Partners: Female  Lifestyle  . Physical activity:    Days per week: Not on file    Minutes per session: Not on file  . Stress: Not on file  Relationships  . Social connections:    Talks on phone: Not on file    Gets together: Not on file    Attends religious service: Not on file    Active member of club or organization: Not on file    Attends meetings of clubs or organizations: Not on file    Relationship status: Not on file  Other Topics Concern  . Not on file  Social History Narrative  . Not on file   Family History: Family History  Problem Relation Age of Onset  . Cancer Paternal Grandfather    Allergies: Allergies    Allergen Reactions  . Sulfa Antibiotics    Medications: See med rec.  Review of Systems: No fevers, chills, night sweats, weight loss, chest pain, or shortness of breath.   Objective:    General: Well Developed, well nourished, and in no acute distress.  Neuro: Alert and oriented x3, extra-ocular muscles intact, sensation grossly intact.  HEENT: Normocephalic, atraumatic, pupils equal round reactive to light, neck supple, no masses, no lymphadenopathy, thyroid nonpalpable.  Skin: Warm and dry, no rashes. Cardiac: Regular rate and rhythm, no murmurs rubs or gallops, no lower extremity edema.  Respiratory: Clear to auscultation bilaterally. Not using accessory muscles, speaking in full sentences.  Impression and Recommendations:    Obesity (BMI 30-39.9) Additional 6 pound weight loss. He has lost about 30 pounds. Continue Topamax twice a day, increasing to full dose acarbose. Because we are no longer using controlled substances I can see him in 3 months. ___________________________________________ Ihor Austin. Benjamin Stain, M.D., ABFM., CAQSM. Primary Care and Sports Medicine [facility]  Adjunct Instructor of Family Medicine  [employer]

## 2018-01-19 NOTE — Assessment & Plan Note (Signed)
Additional 6 pound weight loss. He has lost about 30 pounds. Continue Topamax twice a day, increasing to full dose acarbose. Because we are no longer using controlled substances I can see him in 3 months.

## 2018-03-09 ENCOUNTER — Other Ambulatory Visit: Payer: Self-pay | Admitting: Sports Medicine

## 2018-03-09 DIAGNOSIS — J302 Other seasonal allergic rhinitis: Secondary | ICD-10-CM

## 2018-03-14 ENCOUNTER — Other Ambulatory Visit: Payer: Self-pay | Admitting: Sports Medicine

## 2018-03-14 DIAGNOSIS — E669 Obesity, unspecified: Secondary | ICD-10-CM

## 2018-03-19 ENCOUNTER — Other Ambulatory Visit: Payer: Self-pay | Admitting: Sports Medicine

## 2018-03-19 DIAGNOSIS — E669 Obesity, unspecified: Secondary | ICD-10-CM

## 2018-03-25 ENCOUNTER — Telehealth: Payer: Self-pay | Admitting: Sports Medicine

## 2018-03-25 DIAGNOSIS — Z0000 Encounter for general adult medical examination without abnormal findings: Secondary | ICD-10-CM

## 2018-03-25 DIAGNOSIS — E669 Obesity, unspecified: Secondary | ICD-10-CM

## 2018-03-25 NOTE — Telephone Encounter (Signed)
Pt called. He wants to come by on Friday 7/26 to pick up lab order  for physical scheduled 8/13.

## 2018-03-26 NOTE — Telephone Encounter (Signed)
Thanks. Patient is aware.

## 2018-03-26 NOTE — Telephone Encounter (Signed)
Orders placed.

## 2018-03-28 LAB — CBC
HCT: 44.6 % (ref 38.5–50.0)
Hemoglobin: 15.1 g/dL (ref 13.2–17.1)
MCH: 30.8 pg (ref 27.0–33.0)
MCHC: 33.9 g/dL (ref 32.0–36.0)
MCV: 91 fL (ref 80.0–100.0)
MPV: 11.5 fL (ref 7.5–12.5)
Platelets: 210 10*3/uL (ref 140–400)
RBC: 4.9 Million/uL (ref 4.20–5.80)
RDW: 12.4 % (ref 11.0–15.0)
WBC: 5.2 Thousand/uL (ref 3.8–10.8)

## 2018-03-28 LAB — COMPREHENSIVE METABOLIC PANEL
ALT: 16 U/L (ref 9–46)
Albumin: 4.6 g/dL (ref 3.6–5.1)
Alkaline phosphatase (APISO): 57 U/L (ref 40–115)
BUN: 17 mg/dL (ref 7–25)
Calcium: 9.3 mg/dL (ref 8.6–10.3)
Chloride: 107 mmol/L (ref 98–110)
Creat: 1.06 mg/dL (ref 0.60–1.35)
Potassium: 3.8 mmol/L (ref 3.5–5.3)
Sodium: 140 mmol/L (ref 135–146)
Total Bilirubin: 0.5 mg/dL (ref 0.2–1.2)
Total Protein: 6.4 g/dL (ref 6.1–8.1)

## 2018-03-28 LAB — COMPREHENSIVE METABOLIC PANEL WITH GFR
AG Ratio: 2.6 (calc) — ABNORMAL HIGH (ref 1.0–2.5)
AST: 16 U/L (ref 10–40)
CO2: 26 mmol/L (ref 20–32)
Globulin: 1.8 g/dL — ABNORMAL LOW (ref 1.9–3.7)
Glucose, Bld: 86 mg/dL (ref 65–99)

## 2018-03-28 LAB — LIPID PANEL W/REFLEX DIRECT LDL
Cholesterol: 153 mg/dL (ref <200)
HDL: 64 mg/dL (ref >40)
LDL Cholesterol (Calc): 73 mg/dL (calc)
Non-HDL Cholesterol (Calc): 89 mg/dL (ref <130)
Total CHOL/HDL Ratio: 2.4 (calc) (ref <5.0)
Triglycerides: 77 mg/dL (ref <150)

## 2018-03-28 LAB — TSH: TSH: 0.74 mIU/L (ref 0.40–4.50)

## 2018-03-28 LAB — HEMOGLOBIN A1C
Hgb A1c MFr Bld: 4.9 % of total Hgb (ref <5.7)
Mean Plasma Glucose: 94 (calc)
eAG (mmol/L): 5.2 (calc)

## 2018-03-28 LAB — VITAMIN D 25 HYDROXY (VIT D DEFICIENCY, FRACTURES): Vit D, 25-Hydroxy: 45 ng/mL (ref 30–100)

## 2018-04-02 ENCOUNTER — Encounter: Payer: BC Managed Care – PPO | Admitting: Sports Medicine

## 2018-04-14 ENCOUNTER — Ambulatory Visit (INDEPENDENT_AMBULATORY_CARE_PROVIDER_SITE_OTHER): Payer: BC Managed Care – PPO | Admitting: Sports Medicine

## 2018-04-14 ENCOUNTER — Ambulatory Visit: Payer: BC Managed Care – PPO | Admitting: Sports Medicine

## 2018-04-14 ENCOUNTER — Encounter: Payer: Self-pay | Admitting: Sports Medicine

## 2018-04-14 DIAGNOSIS — F524 Premature ejaculation: Secondary | ICD-10-CM

## 2018-04-14 DIAGNOSIS — J302 Other seasonal allergic rhinitis: Secondary | ICD-10-CM | POA: Diagnosis not present

## 2018-04-14 DIAGNOSIS — Z0000 Encounter for general adult medical examination without abnormal findings: Secondary | ICD-10-CM | POA: Diagnosis not present

## 2018-04-14 MED ORDER — SERTRALINE HCL 50 MG PO TABS
50.0000 mg | ORAL_TABLET | Freq: Every day | ORAL | 3 refills | Status: DC
Start: 2018-04-14 — End: 2018-05-09

## 2018-04-14 MED ORDER — TRAMADOL HCL 50 MG PO TABS: 50.0000 mg | ORAL_TABLET | Freq: Three times a day (TID) | ORAL | 0 refills | Status: DC | PRN

## 2018-04-14 MED ORDER — LEVOCETIRIZINE DIHYDROCHLORIDE 5 MG PO TABS: ORAL_TABLET | ORAL | 3 refills | Status: DC

## 2018-04-14 NOTE — Progress Notes (Signed)
Subjective:    CC: Annual physical exam  HPI:  Jimmy Garcia is a pleasant 47 year old male, here for his physical.  He has no complaints, his only difficulty is premature ejaculation, he is been on Prozac in the past for this which was not effective, has never tried any other SSRIs or tramadol.  I reviewed the past medical history, family history, social history, surgical history, and allergies today and no changes were needed.  Please see the problem list section below in epic for further details.  Past Medical History: Past Medical History:  Diagnosis Date  . Allergy    Past Surgical History: Past Surgical History:  Procedure Laterality Date  . ANTERIOR CRUCIATE LIGAMENT REPAIR Right    01/2015   Social History: Social History   Socioeconomic History  . Marital status: Single    Spouse name: Jimmy Garcia  . Number of children: Not on file  . Years of education: Not on file  . Highest education level: Not on file  Occupational History  . Occupation: Education  Social Needs  . Financial resource strain: Not on file  . Food insecurity:    Worry: Not on file    Inability: Not on file  . Transportation needs:    Medical: Not on file    Non-medical: Not on file  Tobacco Use  . Smoking status: Never Smoker  . Smokeless tobacco: Never Used  Substance and Sexual Activity  . Alcohol use: No  . Drug use: No  . Sexual activity: Yes    Partners: Female  Lifestyle  . Physical activity:    Days per week: Not on file    Minutes per session: Not on file  . Stress: Not on file  Relationships  . Social connections:    Talks on phone: Not on file    Gets together: Not on file    Attends religious service: Not on file    Active member of club or organization: Not on file    Attends meetings of clubs or organizations: Not on file    Relationship status: Not on file  Other Topics Concern  . Not on file  Social History Narrative  . Not on file   Family History: Family  History  Problem Relation Age of Onset  . Cancer Paternal Grandfather    Allergies: Allergies  Allergen Reactions  . Sulfa Antibiotics    Medications: See med rec.  Review of Systems: No headache, visual changes, nausea, vomiting, diarrhea, constipation, dizziness, abdominal pain, skin rash, fevers, chills, night sweats, swollen lymph nodes, weight loss, chest pain, body aches, joint swelling, muscle aches, shortness of breath, mood changes, visual or auditory hallucinations.  Objective:    General: Well Developed, well nourished, and in no acute distress.  Neuro: Alert and oriented x3, extra-ocular muscles intact, sensation grossly intact. Cranial nerves II through XII are intact, motor, sensory, and coordinative functions are all intact. HEENT: Normocephalic, atraumatic, pupils equal round reactive to light, neck supple, no masses, no lymphadenopathy, thyroid nonpalpable. Oropharynx, nasopharynx, external ear canals are unremarkable. Skin: Warm and dry, no rashes noted.  Cardiac: Regular rate and rhythm, no murmurs rubs or gallops.  Respiratory: Clear to auscultation bilaterally. Not using accessory muscles, speaking in full sentences.  Abdominal: Soft, nontender, nondistended, positive bowel sounds, no masses, no organomegaly.  Musculoskeletal: Shoulder, elbow, wrist, hip, knee, ankle stable, and with full range of motion.  Impression and Recommendations:    The patient was counselled, risk factors were discussed, anticipatory guidance given.  Annual physical exam Unremarkable physical exam. Labs look good.   Premature ejaculation Did not have much efficacy with Prozac in the past. We are going to try tramadol initially, he will start Zoloft 50 if this does not work, and understands we need to give it the due diligence of up titration dose. If this does not work we will try Celexa. ___________________________________________ Jimmy Garcia, M.D., ABFM.,  CAQSM. Primary Care and Sports Medicine [facility]  Adjunct Instructor of Family Medicine  [employer]

## 2018-04-14 NOTE — Assessment & Plan Note (Signed)
Did not have much efficacy with Prozac in the past. We are going to try tramadol initially, he will start Zoloft 50 if this does not work, and understands we need to give it the due diligence of up titration dose. If this does not work we will try Celexa.

## 2018-04-14 NOTE — Assessment & Plan Note (Signed)
Unremarkable physical exam. Labs look good.

## 2018-04-26 ENCOUNTER — Other Ambulatory Visit: Payer: Self-pay | Admitting: Sports Medicine

## 2018-04-26 DIAGNOSIS — M25511 Pain in right shoulder: Principal | ICD-10-CM

## 2018-04-26 DIAGNOSIS — G8929 Other chronic pain: Secondary | ICD-10-CM

## 2018-05-09 ENCOUNTER — Other Ambulatory Visit: Payer: Self-pay | Admitting: Sports Medicine

## 2018-05-09 DIAGNOSIS — F524 Premature ejaculation: Secondary | ICD-10-CM

## 2018-05-15 ENCOUNTER — Ambulatory Visit: Payer: BC Managed Care – PPO | Admitting: Sports Medicine

## 2018-06-05 ENCOUNTER — Ambulatory Visit (INDEPENDENT_AMBULATORY_CARE_PROVIDER_SITE_OTHER): Payer: BC Managed Care – PPO | Admitting: Sports Medicine

## 2018-06-05 ENCOUNTER — Encounter: Payer: Self-pay | Admitting: Sports Medicine

## 2018-06-05 VITALS — BP 108/71 | HR 64 | Ht 69.0 in | Wt 197.0 lb

## 2018-06-05 DIAGNOSIS — F524 Premature ejaculation: Secondary | ICD-10-CM | POA: Diagnosis not present

## 2018-06-05 DIAGNOSIS — E669 Obesity, unspecified: Secondary | ICD-10-CM

## 2018-06-05 DIAGNOSIS — Z23 Encounter for immunization: Secondary | ICD-10-CM | POA: Diagnosis not present

## 2018-06-05 MED ORDER — ACARBOSE 100 MG PO TABS: 100.0000 mg | ORAL_TABLET | Freq: Three times a day (TID) | ORAL | 3 refills | Status: DC

## 2018-06-05 MED ORDER — TOPIRAMATE 100 MG PO TABS: 100.0000 mg | ORAL_TABLET | Freq: Two times a day (BID) | ORAL | 3 refills | Status: DC

## 2018-06-05 NOTE — Assessment & Plan Note (Signed)
Did not have much efficacy with Prozac in the past. Tramadol worked but his pain management provider would prefer that he not use a controlled substance, so we are starting Zoloft 50 mg daily with the due diligence of up titration of dose for premature ejaculation. If this does not work we can try Celexa.

## 2018-06-05 NOTE — Assessment & Plan Note (Signed)
Did gain a bit of weight but lost another 4. He has stopped phentermine but is continuing Topamax, increasing to 100 mg twice a day, continue full dose acarbose. He will also do some more resistance training.

## 2018-06-05 NOTE — Progress Notes (Signed)
Subjective:    CC: Obesity  HPI: Jimmy Garcia returns, he gained a bit of weight at the last visit but has since lost it.  He continues with 50 of Topamax twice a day, acarbose full dose and is off of phentermine.  He does desire to lose about 5-10 more pounds.  Premature ejaculation: We did start with tramadol, his pain doctor would prefer him be on a noncontrolled substance so we did switch to Zoloft, he just has not started it yet.  I reviewed the past medical history, family history, social history, surgical history, and allergies today and no changes were needed.  Please see the problem list section below in epic for further details.  Past Medical History: Past Medical History:  Diagnosis Date  . Allergy    Past Surgical History: Past Surgical History:  Procedure Laterality Date  . ANTERIOR CRUCIATE LIGAMENT REPAIR Right    01/2015   Social History: Social History   Socioeconomic History  . Marital status: Single    Spouse name: Jimmy Garcia  . Number of children: Not on file  . Years of education: Not on file  . Highest education level: Not on file  Occupational History  . Occupation: Education  Social Needs  . Financial resource strain: Not on file  . Food insecurity:    Worry: Not on file    Inability: Not on file  . Transportation needs:    Medical: Not on file    Non-medical: Not on file  Tobacco Use  . Smoking status: Never Smoker  . Smokeless tobacco: Never Used  Substance and Sexual Activity  . Alcohol use: No  . Drug use: No  . Sexual activity: Yes    Partners: Female  Lifestyle  . Physical activity:    Days per week: Not on file    Minutes per session: Not on file  . Stress: Not on file  Relationships  . Social connections:    Talks on phone: Not on file    Gets together: Not on file    Attends religious service: Not on file    Active member of club or organization: Not on file    Attends meetings of clubs or organizations: Not on file   Relationship status: Not on file  Other Topics Concern  . Not on file  Social History Narrative  . Not on file   Family History: Family History  Problem Relation Age of Onset  . Cancer Paternal Grandfather    Allergies: Allergies  Allergen Reactions  . Sulfa Antibiotics    Medications: See med rec.  Review of Systems: No fevers, chills, night sweats, weight loss, chest pain, or shortness of breath.   Objective:    General: Well Developed, well nourished, and in no acute distress.  Neuro: Alert and oriented x3, extra-ocular muscles intact, sensation grossly intact.  HEENT: Normocephalic, atraumatic, pupils equal round reactive to light, neck supple, no masses, no lymphadenopathy, thyroid nonpalpable.  Skin: Warm and dry, no rashes. Cardiac: Regular rate and rhythm, no murmurs rubs or gallops, no lower extremity edema.  Respiratory: Clear to auscultation bilaterally. Not using accessory muscles, speaking in full sentences.  Impression and Recommendations:    Obesity (BMI 30-39.9) Did gain a bit of weight but lost another 4. He has stopped phentermine but is continuing Topamax, increasing to 100 mg twice a day, continue full dose acarbose. He will also do some more resistance training.  Premature ejaculation Did not have much efficacy with Prozac in the  past. Tramadol worked but his pain management provider would prefer that he not use a controlled substance, so we are starting Zoloft 50 mg daily with the due diligence of up titration of dose for premature ejaculation. If this does not work we can try Celexa. ___________________________________________ Ihor Austin. Benjamin Stain, M.D., ABFM., CAQSM. Primary Care and Sports Medicine [facility]  Adjunct Instructor of Family Medicine  [employer]

## 2018-06-18 ENCOUNTER — Other Ambulatory Visit: Payer: Self-pay | Admitting: Sports Medicine

## 2018-06-18 DIAGNOSIS — E669 Obesity, unspecified: Secondary | ICD-10-CM

## 2018-07-26 ENCOUNTER — Other Ambulatory Visit: Payer: Self-pay | Admitting: Sports Medicine

## 2018-07-26 DIAGNOSIS — J302 Other seasonal allergic rhinitis: Secondary | ICD-10-CM

## 2018-09-04 ENCOUNTER — Ambulatory Visit: Payer: BC Managed Care – PPO | Admitting: Sports Medicine

## 2018-09-04 ENCOUNTER — Encounter: Payer: Self-pay | Admitting: Sports Medicine

## 2018-09-04 DIAGNOSIS — M19011 Primary osteoarthritis, right shoulder: Secondary | ICD-10-CM | POA: Diagnosis not present

## 2018-09-04 DIAGNOSIS — E669 Obesity, unspecified: Secondary | ICD-10-CM

## 2018-09-04 NOTE — Progress Notes (Signed)
Subjective:    CC: Weight check  HPI: Gained a few pounds over the holidays, only on Topamax and acarbose.  Right shoulder pain: Moderate, persistent, glenohumeral, previous injection was a year and a half ago, now having a recurrence of pain, desires repeat interventional treatment today.  I reviewed the past medical history, family history, social history, surgical history, and allergies today and no changes were needed.  Please see the problem list section below in epic for further details.  Past Medical History: Past Medical History:  Diagnosis Date  . Allergy    Past Surgical History: Past Surgical History:  Procedure Laterality Date  . ANTERIOR CRUCIATE LIGAMENT REPAIR Right    01/2015   Social History: Social History   Socioeconomic History  . Marital status: Single    Spouse name: Dalen Largent  . Number of children: Not on file  . Years of education: Not on file  . Highest education level: Not on file  Occupational History  . Occupation: Education  Social Needs  . Financial resource strain: Not on file  . Food insecurity:    Worry: Not on file    Inability: Not on file  . Transportation needs:    Medical: Not on file    Non-medical: Not on file  Tobacco Use  . Smoking status: Never Smoker  . Smokeless tobacco: Never Used  Substance and Sexual Activity  . Alcohol use: No  . Drug use: No  . Sexual activity: Yes    Partners: Female  Lifestyle  . Physical activity:    Days per week: Not on file    Minutes per session: Not on file  . Stress: Not on file  Relationships  . Social connections:    Talks on phone: Not on file    Gets together: Not on file    Attends religious service: Not on file    Active member of club or organization: Not on file    Attends meetings of clubs or organizations: Not on file    Relationship status: Not on file  Other Topics Concern  . Not on file  Social History Narrative  . Not on file   Family History: Family  History  Problem Relation Age of Onset  . Cancer Paternal Grandfather    Allergies: Allergies  Allergen Reactions  . Sulfa Antibiotics    Medications: See med rec.  Review of Systems: No fevers, chills, night sweats, weight loss, chest pain, or shortness of breath.   Objective:    General: Well Developed, well nourished, and in no acute distress.  Neuro: Alert and oriented x3, extra-ocular muscles intact, sensation grossly intact.  HEENT: Normocephalic, atraumatic, pupils equal round reactive to light, neck supple, no masses, no lymphadenopathy, thyroid nonpalpable.  Skin: Warm and dry, no rashes. Cardiac: Regular rate and rhythm, no murmurs rubs or gallops, no lower extremity edema.  Respiratory: Clear to auscultation bilaterally. Not using accessory muscles, speaking in full sentences. Right shoulder: Inspection reveals no abnormalities, atrophy or asymmetry. Palpation is normal with no tenderness over AC joint or bicipital groove. ROM is full in all planes. Rotator cuff strength normal throughout. No signs of impingement with negative Neer and [name]'s tests, empty can. Speeds and Yergason's tests normal. Positive Obrien's, negative crank, negative clunk, and good stability. Normal scapular function observed. No painful arc and no drop arm sign. No apprehension sign  Procedure: Real-time Ultrasound Guided Injection of right glenohumeral joint Device: GE Logiq E  Verbal informed consent obtained.  Time-out  conducted.  Noted no overlying erythema, induration, or other signs of local infection.  Skin prepped in a sterile fashion.  Local anesthesia: Topical Ethyl chloride.  With sterile technique and under real time ultrasound guidance: Using a posterior approach I advanced into the glenohumeral joint and injected 1 cc Kenalog 40, 2 cc lidocaine, 2 cc bupivacaine. Completed without difficulty  Pain immediately resolved suggesting accurate placement of the medication.    Advised to call if fevers/chills, erythema, induration, drainage, or persistent bleeding.  Images permanently stored and available for review in the ultrasound unit.  Impression: Technically successful ultrasound guided injection.  Impression and Recommendations:    Primary osteoarthritis of right shoulder Previous glenohumeral injection was in July 2018. Repeated today.   Obesity (BMI 30-39.9) Weight continues to be stable, gained 3 pounds over the holidays. Continuing Topamax, we can continue this and acarbose long-term, no further change needed.   He did restart his gym membership. ___________________________________________ Ihor Austinhomas J. Benjamin Stainhekkekandam, M.D., ABFM., CAQSM. Primary Care and Sports Medicine [facility]  Adjunct Professor of Family Medicine  [employer]

## 2018-09-04 NOTE — Assessment & Plan Note (Signed)
Weight continues to be stable, gained 3 pounds over the holidays. Continuing Topamax, we can continue this and acarbose long-term, no further change needed.   He did restart his gym membership.

## 2018-09-04 NOTE — Assessment & Plan Note (Signed)
Previous glenohumeral injection was in July 2018. Repeated today.

## 2018-09-25 ENCOUNTER — Other Ambulatory Visit: Payer: Self-pay | Admitting: Sports Medicine

## 2018-09-25 DIAGNOSIS — J302 Other seasonal allergic rhinitis: Secondary | ICD-10-CM

## 2018-10-21 ENCOUNTER — Other Ambulatory Visit: Payer: Self-pay | Admitting: Sports Medicine

## 2018-10-21 DIAGNOSIS — M25511 Pain in right shoulder: Principal | ICD-10-CM

## 2018-10-21 DIAGNOSIS — G8929 Other chronic pain: Secondary | ICD-10-CM

## 2018-10-28 ENCOUNTER — Other Ambulatory Visit: Payer: Self-pay | Admitting: Sports Medicine

## 2018-10-28 DIAGNOSIS — J302 Other seasonal allergic rhinitis: Secondary | ICD-10-CM

## 2018-10-29 ENCOUNTER — Encounter: Payer: Self-pay | Admitting: Sports Medicine

## 2018-10-29 DIAGNOSIS — F524 Premature ejaculation: Secondary | ICD-10-CM

## 2018-10-29 MED ORDER — SERTRALINE HCL 100 MG PO TABS: 100.0000 mg | ORAL_TABLET | Freq: Every day | ORAL | 3 refills | Status: DC

## 2018-11-22 ENCOUNTER — Other Ambulatory Visit: Payer: Self-pay | Admitting: Sports Medicine

## 2018-11-22 DIAGNOSIS — F524 Premature ejaculation: Secondary | ICD-10-CM

## 2019-01-22 ENCOUNTER — Other Ambulatory Visit: Payer: Self-pay | Admitting: Sports Medicine

## 2019-01-22 DIAGNOSIS — J302 Other seasonal allergic rhinitis: Secondary | ICD-10-CM

## 2019-02-04 ENCOUNTER — Encounter: Payer: Self-pay | Admitting: Sports Medicine

## 2019-02-04 DIAGNOSIS — F524 Premature ejaculation: Secondary | ICD-10-CM

## 2019-03-17 ENCOUNTER — Other Ambulatory Visit: Payer: Self-pay | Admitting: Sports Medicine

## 2019-03-17 DIAGNOSIS — E669 Obesity, unspecified: Secondary | ICD-10-CM

## 2019-03-17 MED ORDER — TOPIRAMATE 100 MG PO TABS: 100.0000 mg | ORAL_TABLET | Freq: Two times a day (BID) | ORAL | 3 refills | Status: DC

## 2019-03-22 ENCOUNTER — Ambulatory Visit (INDEPENDENT_AMBULATORY_CARE_PROVIDER_SITE_OTHER): Payer: BC Managed Care – PPO | Admitting: Sports Medicine

## 2019-03-22 ENCOUNTER — Other Ambulatory Visit: Payer: Self-pay

## 2019-03-22 ENCOUNTER — Encounter: Payer: Self-pay | Admitting: Sports Medicine

## 2019-03-22 DIAGNOSIS — M7542 Impingement syndrome of left shoulder: Secondary | ICD-10-CM

## 2019-03-22 DIAGNOSIS — I872 Venous insufficiency (chronic) (peripheral): Secondary | ICD-10-CM | POA: Diagnosis not present

## 2019-03-22 NOTE — Assessment & Plan Note (Signed)
Starting conservatively with rehabilitation exercises, return in 6 weeks, injection if no better.

## 2019-03-22 NOTE — Assessment & Plan Note (Signed)
No specific treatment is necessary, I did recommend that he consider lower extremity compression hose. He will think about this. 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

## 2019-03-22 NOTE — Progress Notes (Signed)
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  Over the past several weeks he is noted increasing redness, rash over the dorsum of his right shin, no pain, no pruritus.  In addition he is noted pain in his left shoulder, over the deltoid, worse with overhead activities, waking him from sleep, moderate, persistent, localized without radiation.  I reviewed the past medical history, family history, social history, surgical history, and allergies today and no changes were needed.  Please see the problem list section below in epic for further details.  Past Medical History: Past Medical History:  Diagnosis Date  . Allergy    Past Surgical History: Past Surgical History:  Procedure Laterality Date  . ANTERIOR CRUCIATE LIGAMENT REPAIR Right    01/2015   Social History: Social History   Socioeconomic History  . Marital status: Single    Spouse name: Doristine MangoJennifer Fenter  . Number of children: Not on file  . Years of education: Not on file  . Highest education level: Not on file  Occupational History  . Occupation: Education  Social Needs  . Financial resource strain: Not on file  . Food insecurity    Worry: Not on file    Inability: Not on file  . Transportation needs    Medical: Not on file    Non-medical: Not on file  Tobacco Use  . Smoking status: Never Smoker  . Smokeless tobacco: Never Used  Substance and Sexual Activity  . Alcohol use: No  . Drug use: No  . Sexual activity: Yes    Partners: Female  Lifestyle  . Physical activity    Days per week: Not on file    Minutes per session: Not on file  . Stress: Not on file  Relationships  . Social Musicianconnections    Talks on phone: Not on file    Gets together: Not on file    Attends religious service: Not on file    Active member of club or organization: Not on file    Attends meetings of clubs or  organizations: Not on file    Relationship status: Not on file  Other Topics Concern  . Not on file  Social History Narrative  . Not on file   Family History: Family History  Problem Relation Age of Onset  . Cancer Paternal Grandfather    Allergies: Allergies  Allergen Reactions  . Sulfa Antibiotics    Medications: See med rec.  Review of Systems: No fevers, chills, night sweats, weight loss, chest pain, or shortness of breath.   Objective:    General: Well Developed, well nourished, and in no acute distress.  Neuro: Alert and oriented x3, extra-ocular muscles intact, sensation grossly intact.  HEENT: Normocephalic, atraumatic, pupils equal round reactive to light, neck supple, no masses, no lymphadenopathy, thyroid nonpalpable.  Skin: Warm and dry, rusty appearing venous stasis dermatitis over the dorsal shin with underlying varicosities and reticular veins. Cardiac: Regular rate and rhythm, no murmurs rubs or gallops, no lower extremity edema.  Respiratory: Clear to auscultation bilaterally. Not using accessory muscles, speaking in full sentences. Left shoulder: Inspection reveals no abnormalities, atrophy or asymmetry. Palpation is normal with no tenderness over AC joint or bicipital groove. ROM is full in all planes. Rotator cuff strength normal throughout. Positive Neer and [name]'s tests, empty can. Speeds and Yergason's tests normal. No labral pathology  noted with negative Obrien's, negative crank, negative clunk, and good stability. Normal scapular function observed. No painful arc and no drop arm sign. No apprehension sign  Impression and Recommendations:    Impingement syndrome of left shoulder Starting conservatively with rehabilitation exercises, return in 6 weeks, injection if no better.  Venous stasis dermatitis of right lower extremity No specific treatment is necessary, I did recommend that he consider lower extremity compression hose. He will think  about this. 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   ___________________________________________ Gwen Her. Dianah Field, M.D., ABFM., CAQSM. Primary Care and Sports Medicine [place] [facility]  Adjunct Professor of [employer] [facility] of [employer]

## 2019-03-22 NOTE — Patient Instructions (Signed)

## 2019-04-19 ENCOUNTER — Other Ambulatory Visit: Payer: Self-pay | Admitting: Sports Medicine

## 2019-04-19 DIAGNOSIS — M25511 Pain in right shoulder: Secondary | ICD-10-CM

## 2019-04-19 DIAGNOSIS — G8929 Other chronic pain: Secondary | ICD-10-CM

## 2019-05-03 ENCOUNTER — Ambulatory Visit (INDEPENDENT_AMBULATORY_CARE_PROVIDER_SITE_OTHER): Payer: BC Managed Care – PPO | Admitting: Sports Medicine

## 2019-05-03 ENCOUNTER — Other Ambulatory Visit: Payer: Self-pay

## 2019-05-03 ENCOUNTER — Encounter: Payer: Self-pay | Admitting: Sports Medicine

## 2019-05-03 ENCOUNTER — Ambulatory Visit (INDEPENDENT_AMBULATORY_CARE_PROVIDER_SITE_OTHER): Payer: BC Managed Care – PPO

## 2019-05-03 VITALS — BP 97/64 | HR 52 | Ht 69.0 in | Wt 203.0 lb

## 2019-05-03 DIAGNOSIS — Z23 Encounter for immunization: Secondary | ICD-10-CM

## 2019-05-03 DIAGNOSIS — E669 Obesity, unspecified: Secondary | ICD-10-CM

## 2019-05-03 DIAGNOSIS — Z0000 Encounter for general adult medical examination without abnormal findings: Secondary | ICD-10-CM | POA: Diagnosis not present

## 2019-05-03 DIAGNOSIS — M7542 Impingement syndrome of left shoulder: Secondary | ICD-10-CM

## 2019-05-03 NOTE — Assessment & Plan Note (Signed)
Routine physical as above. Flu shot given today, routine labs.

## 2019-05-03 NOTE — Assessment & Plan Note (Signed)
Did not respond to conservative rehabilitation exercises, greater than 6 weeks. Symptoms are declaring themselves as bicipital. I would like an MRI today. We will need an x-ray first.

## 2019-05-03 NOTE — Progress Notes (Signed)
Subjective:    CC: Annual physical exam  HPI:  Jimmy Garcia is here for his physical, he has had left shoulder pain now for some time, he has failed greater than 6 weeks of conservative measures, clinically it resembled a rotator cuff pathology at the initial visit, today it is more anterior and he has a sensation of instability, waking her up at night, worse with overhead activities, shoulder flexion.  I reviewed the past medical history, family history, social history, surgical history, and allergies today and no changes were needed.  Please see the problem list section below in epic for further details.  Past Medical History: Past Medical History:  Diagnosis Date  . Allergy    Past Surgical History: Past Surgical History:  Procedure Laterality Date  . ANTERIOR CRUCIATE LIGAMENT REPAIR Right    01/2015   Social History: Social History   Socioeconomic History  . Marital status: Married    Spouse name: Doristine MangoJennifer Mcgonagle  . Number of children: Not on file  . Years of education: Not on file  . Highest education level: Not on file  Occupational History  . Occupation: Education  Social Needs  . Financial resource strain: Not on file  . Food insecurity    Worry: Not on file    Inability: Not on file  . Transportation needs    Medical: Not on file    Non-medical: Not on file  Tobacco Use  . Smoking status: Never Smoker  . Smokeless tobacco: Never Used  Substance and Sexual Activity  . Alcohol use: No  . Drug use: No  . Sexual activity: Yes    Partners: Female  Lifestyle  . Physical activity    Days per week: Not on file    Minutes per session: Not on file  . Stress: Not on file  Relationships  . Social Musicianconnections    Talks on phone: Not on file    Gets together: Not on file    Attends religious service: Not on file    Active member of club or organization: Not on file    Attends meetings of clubs or organizations: Not on file    Relationship status: Not on file  Other  Topics Concern  . Not on file  Social History Narrative  . Not on file   Family History: Family History  Problem Relation Age of Onset  . Cancer Paternal Grandfather    Allergies: Allergies  Allergen Reactions  . Sulfa Antibiotics    Medications: See med rec.  Review of Systems: No headache, visual changes, nausea, vomiting, diarrhea, constipation, dizziness, abdominal pain, skin rash, fevers, chills, night sweats, swollen lymph nodes, weight loss, chest pain, body aches, joint swelling, muscle aches, shortness of breath, mood changes, visual or auditory hallucinations.  Objective:    General: Well Developed, well nourished, and in no acute distress.  Neuro: Alert and oriented x3, extra-ocular muscles intact, sensation grossly intact. Cranial nerves II through XII are intact, motor, sensory, and coordinative functions are all intact. HEENT: Normocephalic, atraumatic, pupils equal round reactive to light, neck supple, no masses, no lymphadenopathy, thyroid nonpalpable. Oropharynx, nasopharynx, external ear canals are unremarkable. Skin: Warm and dry, no rashes noted.  Cardiac: Regular rate and rhythm, no murmurs rubs or gallops.  Respiratory: Clear to auscultation bilaterally. Not using accessory muscles, speaking in full sentences.  Abdominal: Soft, nontender, nondistended, positive bowel sounds, no masses, no organomegaly.  Left shoulder: Inspection reveals no abnormalities, atrophy or asymmetry. Palpation is normal with no tenderness  over [employer] joint or bicipital groove. ROM is full in all planes. Rotator cuff strength normal throughout. Mildly positive Neer and [name]'s tests, empty can. Speeds and Yergason's tests both abnormal. No labral pathology noted with negative Obrien's, negative crank, negative clunk, and good stability. Normal scapular function observed. No painful arc and no drop arm sign. No apprehension sign  Impression and Recommendations:    The patient was  counselled, risk factors were discussed, anticipatory guidance given.  Annual physical exam Routine physical as above. Flu shot given today, routine labs.   Impingement syndrome of left shoulder Did not respond to conservative rehabilitation exercises, greater than 6 weeks. Symptoms are declaring themselves as bicipital. I would like an MRI today. We will need an x-ray first.   ___________________________________________ Gwen Her. Dianah Field, M.D., ABFM., CAQSM. Primary Care and Sports Medicine  [facility]  Adjunct Professor of [employer] [facility] [employer] Medicine

## 2019-05-04 LAB — CBC
HCT: 42.4 % (ref 38.5–50.0)
Hemoglobin: 14.4 g/dL (ref 13.2–17.1)
MCH: 31.4 pg (ref 27.0–33.0)
MCHC: 34 g/dL (ref 32.0–36.0)
MCV: 92.6 fL (ref 80.0–100.0)
MPV: 11.9 fL (ref 7.5–12.5)
Platelets: 186 10*3/uL (ref 140–400)
RBC: 4.58 10*6/uL (ref 4.20–5.80)
RDW: 12.6 % (ref 11.0–15.0)
WBC: 4.6 10*3/uL (ref 3.8–10.8)

## 2019-05-04 LAB — TSH: TSH: 0.73 mIU/L (ref 0.40–4.50)

## 2019-05-04 LAB — COMPLETE METABOLIC PANEL WITH GFR
AG Ratio: 2.8 (calc) — ABNORMAL HIGH (ref 1.0–2.5)
ALT: 17 U/L (ref 9–46)
AST: 20 U/L (ref 10–40)
Albumin: 4.4 g/dL (ref 3.6–5.1)
Alkaline phosphatase (APISO): 52 U/L (ref 36–130)
BUN: 15 mg/dL (ref 7–25)
CO2: 25 mmol/L (ref 20–32)
Calcium: 9.2 mg/dL (ref 8.6–10.3)
Chloride: 112 mmol/L — ABNORMAL HIGH (ref 98–110)
Creat: 0.98 mg/dL (ref 0.60–1.35)
GFR, Est African American: 107 mL/min/{1.73_m2} (ref >=60)
GFR, Est Non African American: 92 mL/min/{1.73_m2} (ref >=60)
Globulin: 1.6 g/dL (calc) — ABNORMAL LOW (ref 1.9–3.7)
Glucose, Bld: 81 mg/dL (ref 65–99)
Potassium: 3.7 mmol/L (ref 3.5–5.3)
Sodium: 142 mmol/L (ref 135–146)
Total Bilirubin: 0.6 mg/dL (ref 0.2–1.2)
Total Protein: 6 g/dL — ABNORMAL LOW (ref 6.1–8.1)

## 2019-05-04 LAB — HEMOGLOBIN A1C
Hgb A1c MFr Bld: 4.9 % of total Hgb (ref <5.7)
Mean Plasma Glucose: 94 (calc)
eAG (mmol/L): 5.2 (calc)

## 2019-05-04 LAB — LIPID PANEL W/REFLEX DIRECT LDL
Cholesterol: 167 mg/dL (ref <200)
HDL: 55 mg/dL (ref >=40)
LDL Cholesterol (Calc): 95 mg/dL (calc)
Non-HDL Cholesterol (Calc): 112 mg/dL (calc) (ref <130)
Total CHOL/HDL Ratio: 3 (calc) (ref <5.0)
Triglycerides: 78 mg/dL (ref <150)

## 2019-05-05 ENCOUNTER — Other Ambulatory Visit: Payer: Self-pay | Admitting: Sports Medicine

## 2019-05-05 ENCOUNTER — Encounter: Payer: Self-pay | Admitting: Sports Medicine

## 2019-05-05 ENCOUNTER — Ambulatory Visit (INDEPENDENT_AMBULATORY_CARE_PROVIDER_SITE_OTHER): Payer: BC Managed Care – PPO | Admitting: Sports Medicine

## 2019-05-05 ENCOUNTER — Other Ambulatory Visit: Payer: Self-pay

## 2019-05-05 DIAGNOSIS — M7542 Impingement syndrome of left shoulder: Secondary | ICD-10-CM

## 2019-05-05 DIAGNOSIS — J302 Other seasonal allergic rhinitis: Secondary | ICD-10-CM

## 2019-05-05 MED ORDER — LEVOCETIRIZINE DIHYDROCHLORIDE 5 MG PO TABS: ORAL_TABLET | ORAL | 3 refills | Status: DC

## 2019-05-05 NOTE — Assessment & Plan Note (Signed)
Symptoms have declared themselves as impingement, significant pain and weakness with the empty can sign. Subacromial injection today, if no better in a month we will proceed with a glenohumeral injection. MRI showed glenohumeral effusion as well as supraspinatus tendinopathy.

## 2019-05-05 NOTE — Progress Notes (Signed)
Subjective:    CC: Left shoulder pain  HPI: Jimmy Garcia returns, he continues to have pain in his left shoulder, anterior aspect, worse with abduction, overhead activities.  Moderate, persistent, localized without radiation past the elbow.  I reviewed the past medical history, family history, social history, surgical history, and allergies today and no changes were needed.  Please see the problem list section below in epic for further details.  Past Medical History: Past Medical History:  Diagnosis Date  . Allergy    Past Surgical History: Past Surgical History:  Procedure Laterality Date  . ANTERIOR CRUCIATE LIGAMENT REPAIR Right    01/2015   Social History: Social History   Socioeconomic History  . Marital status: Married    Spouse name: Doristine MangoJennifer Schiff  . Number of children: Not on file  . Years of education: Not on file  . Highest education level: Not on file  Occupational History  . Occupation: Education  Social Needs  . Financial resource strain: Not on file  . Food insecurity    Worry: Not on file    Inability: Not on file  . Transportation needs    Medical: Not on file    Non-medical: Not on file  Tobacco Use  . Smoking status: Never Smoker  . Smokeless tobacco: Never Used  Substance and Sexual Activity  . Alcohol use: No  . Drug use: No  . Sexual activity: Yes    Partners: Female  Lifestyle  . Physical activity    Days per week: Not on file    Minutes per session: Not on file  . Stress: Not on file  Relationships  . Social Musicianconnections    Talks on phone: Not on file    Gets together: Not on file    Attends religious service: Not on file    Active member of club or organization: Not on file    Attends meetings of clubs or organizations: Not on file    Relationship status: Not on file  Other Topics Concern  . Not on file  Social History Narrative  . Not on file   Family History: Family History  Problem Relation Age of Onset  . Cancer Paternal  Grandfather    Allergies: Allergies  Allergen Reactions  . Sulfa Antibiotics    Medications: See med rec.  Review of Systems: No fevers, chills, night sweats, weight loss, chest pain, or shortness of breath.   Objective:    General: Well Developed, well nourished, and in no acute distress.  Neuro: Alert and oriented x3, extra-ocular muscles intact, sensation grossly intact.  HEENT: Normocephalic, atraumatic, pupils equal round reactive to light, neck supple, no masses, no lymphadenopathy, thyroid nonpalpable.  Skin: Warm and dry, no rashes. Cardiac: Regular rate and rhythm, no murmurs rubs or gallops, no lower extremity edema.  Respiratory: Clear to auscultation bilaterally. Not using accessory muscles, speaking in full sentences. Left shoulder: Inspection reveals no abnormalities, atrophy or asymmetry. Palpation is normal with no tenderness over AC joint or bicipital groove. ROM is full in all planes. Rotator cuff strength normal throughout. Positive Neer and [name]'s tests, empty can. Speeds and Yergason's tests normal. No labral pathology noted with negative Obrien's, negative crank, negative clunk, and good stability. Normal scapular function observed. No painful arc and no drop arm sign. No apprehension sign  Procedure: Real-time Ultrasound Guided injection of the left subacromial bursa Device: GE Logiq E  Verbal informed consent obtained.  Time-out conducted.  Noted no overlying erythema, induration, or other signs  of local infection.  Skin prepped in a sterile fashion.  Local anesthesia: Topical Ethyl chloride.  With sterile technique and under real time ultrasound guidance:  1 cc Kenalog 40, 1 cc lidocaine, 1 cc bupivacaine injected easily Completed without difficulty  Pain immediately resolved suggesting accurate placement of the medication.  Advised to call if fevers/chills, erythema, induration, drainage, or persistent bleeding.  Images permanently stored and  available for review in the ultrasound unit.  Impression: Technically successful ultrasound guided injection.  Impression and Recommendations:    Impingement syndrome of left shoulder Symptoms have declared themselves as impingement, significant pain and weakness with the empty can sign. Subacromial injection today, if no better in a month we will proceed with a glenohumeral injection. MRI showed glenohumeral effusion as well as supraspinatus tendinopathy.   ___________________________________________ Gwen Her. Dianah Field, M.D., ABFM., CAQSM. Primary Care and Sports Medicine [place] [facility]  Adjunct Professor of [place] [facility] of Medicine

## 2019-06-07 ENCOUNTER — Other Ambulatory Visit: Payer: Self-pay

## 2019-06-07 ENCOUNTER — Encounter: Payer: Self-pay | Admitting: Sports Medicine

## 2019-06-07 ENCOUNTER — Ambulatory Visit: Payer: BC Managed Care – PPO | Admitting: Sports Medicine

## 2019-06-07 DIAGNOSIS — M7542 Impingement syndrome of left shoulder: Secondary | ICD-10-CM | POA: Diagnosis not present

## 2019-06-07 NOTE — Progress Notes (Signed)
  Subjective:    CC: Follow-up  HPI: Left shoulder pain: For the most part resolved.  I reviewed the past medical history, family history, social history, surgical history, and allergies today and no changes were needed.  Please see the problem list section below in epic for further details.  Past Medical History: Past Medical History:  Diagnosis Date  . Allergy    Past Surgical History: Past Surgical History:  Procedure Laterality Date  . ANTERIOR CRUCIATE LIGAMENT REPAIR Right    01/2015   Social History: Social History   Socioeconomic History  . Marital status: Married    Spouse name: Jakari Sada  . Number of children: Not on file  . Years of education: Not on file  . Highest education level: Not on file  Occupational History  . Occupation: Education  Social Needs  . Financial resource strain: Not on file  . Food insecurity    Worry: Not on file    Inability: Not on file  . Transportation needs    Medical: Not on file    Non-medical: Not on file  Tobacco Use  . Smoking status: Never Smoker  . Smokeless tobacco: Never Used  Substance and Sexual Activity  . Alcohol use: No  . Drug use: No  . Sexual activity: Yes    Partners: Female  Lifestyle  . Physical activity    Days per week: Not on file    Minutes per session: Not on file  . Stress: Not on file  Relationships  . Social Herbalist on phone: Not on file    Gets together: Not on file    Attends religious service: Not on file    Active member of club or organization: Not on file    Attends meetings of clubs or organizations: Not on file    Relationship status: Not on file  Other Topics Concern  . Not on file  Social History Narrative  . Not on file   Family History: Family History  Problem Relation Age of Onset  . Cancer Paternal Grandfather    Allergies: Allergies  Allergen Reactions  . Sulfa Antibiotics    Medications: See med rec.  Review of Systems: No fevers, chills,  night sweats, weight loss, chest pain, or shortness of breath.   Objective:    General: Well Developed, well nourished, and in no acute distress.  Neuro: Alert and oriented x3, extra-ocular muscles intact, sensation grossly intact.  HEENT: Normocephalic, atraumatic, pupils equal round reactive to light, neck supple, no masses, no lymphadenopathy, thyroid nonpalpable.  Skin: Warm and dry, no rashes. Cardiac: Regular rate and rhythm, no murmurs rubs or gallops, no lower extremity edema.  Respiratory: Clear to auscultation bilaterally. Not using accessory muscles, speaking in full sentences.  Impression and Recommendations:    Impingement syndrome of left shoulder Subacromial injection at the last visit provided good relief, he still has a bit of stiffness in the morning. MRI did show rotator cuff tendinopathy and a glenohumeral joint effusion. Because he is continuing to improve we will not intervene further, continue home rehab. If he does have persistence of stiffness we will proceed with a glenohumeral injection.   ___________________________________________ Gwen Her. Dianah Field, M.D., ABFM., CAQSM. Primary Care and Sports Medicine [place] [facility]  Adjunct Professor of [place] [employer] [facility] of Medicine

## 2019-06-07 NOTE — Assessment & Plan Note (Signed)
Subacromial injection at the last visit provided good relief, he still has a bit of stiffness in the morning. MRI did show rotator cuff tendinopathy and a glenohumeral joint effusion. Because he is continuing to improve we will not intervene further, continue home rehab. If he does have persistence of stiffness we will proceed with a glenohumeral injection.

## 2019-06-21 ENCOUNTER — Ambulatory Visit (INDEPENDENT_AMBULATORY_CARE_PROVIDER_SITE_OTHER): Payer: BC Managed Care – PPO | Admitting: Sports Medicine

## 2019-06-21 ENCOUNTER — Other Ambulatory Visit: Payer: Self-pay

## 2019-06-21 DIAGNOSIS — R430 Anosmia: Secondary | ICD-10-CM | POA: Diagnosis not present

## 2019-06-21 DIAGNOSIS — M7542 Impingement syndrome of left shoulder: Secondary | ICD-10-CM

## 2019-06-21 DIAGNOSIS — Z20822 Contact with and (suspected) exposure to covid-19: Secondary | ICD-10-CM

## 2019-06-21 NOTE — Assessment & Plan Note (Signed)
He will go to the Covid testing site.

## 2019-06-21 NOTE — Progress Notes (Signed)
Virtual Visit via WebEx/MyChart   I connected with  Jimmy Garcia  on 06/21/19 via WebEx/MyChart/Doximity Video and verified that I am speaking with the correct person using two identifiers.   I discussed the limitations, risks, security and privacy concerns of performing an evaluation and management service by WebEx/MyChart/Doximity Video, including the higher likelihood of inaccurate diagnosis and treatment, and the availability of in person appointments.  We also discussed the likely need of an additional face to face encounter for complete and high quality delivery of care.  I also discussed with the patient that there may be a patient responsible charge related to this service. The patient expressed understanding and wishes to proceed.  Provider location is either at home or medical facility. Patient location is at their home, different from provider location. People involved in care of the patient during this telehealth encounter were myself, my nurse/medical assistant, and my front office/scheduling team member.  Subjective:    CC: Follow-up  HPI: Shoulder pain: Feeling better, he is having some anosmia and upper respiratory symptoms.  Works as a principal.  I reviewed the past medical history, family history, social history, surgical history, and allergies today and no changes were needed.  Please see the problem list section below in epic for further details.  Past Medical History: Past Medical History:  Diagnosis Date  . Allergy    Past Surgical History: Past Surgical History:  Procedure Laterality Date  . ANTERIOR CRUCIATE LIGAMENT REPAIR Right    01/2015   Social History: Social History   Socioeconomic History  . Marital status: Married    Spouse name: Jimmy Garcia  . Number of children: Not on file  . Years of education: Not on file  . Highest education level: Not on file  Occupational History  . Occupation: Education  Social Needs  . Financial resource  strain: Not on file  . Food insecurity    Worry: Not on file    Inability: Not on file  . Transportation needs    Medical: Not on file    Non-medical: Not on file  Tobacco Use  . Smoking status: Never Smoker  . Smokeless tobacco: Never Used  Substance and Sexual Activity  . Alcohol use: No  . Drug use: No  . Sexual activity: Yes    Partners: Female  Lifestyle  . Physical activity    Days per week: Not on file    Minutes per session: Not on file  . Stress: Not on file  Relationships  . Social Musician on phone: Not on file    Gets together: Not on file    Attends religious service: Not on file    Active member of club or organization: Not on file    Attends meetings of clubs or organizations: Not on file    Relationship status: Not on file  Other Topics Concern  . Not on file  Social History Narrative  . Not on file   Family History: Family History  Problem Relation Age of Onset  . Cancer Paternal Grandfather    Allergies: Allergies  Allergen Reactions  . Sulfa Antibiotics    Medications: See med rec.  Review of Systems: No fevers, chills, night sweats, weight loss, chest pain, or shortness of breath.   Objective:    General: Speaking full sentences, no audible heavy breathing.  Sounds alert and appropriately interactive.  Appears well.  Face symmetric.  Extraocular movements intact.  Pupils equal and round.  No nasal flaring or accessory muscle use visualized.  No other physical exam performed due to the non-physical nature of this visit.  Impression and Recommendations:    Impingement syndrome of left shoulder Jimmy Garcia did extremely well with a subacromial injection back in September. He had a bit of stiffness in the morning so we did an MRI that showed a bit of rotator cuff tendinopathy as expected and a glenohumeral joint effusion. Because he is doing so well and only gets an occasional twinge of pain he feels as though he can live with it, no  further intervention needed, though if it comes back we will proceed with a glenohumeral injection.  Anosmia He will go to the Covid testing site.  I discussed the above assessment and treatment plan with the patient. The patient was provided an opportunity to ask questions and all were answered. The patient agreed with the plan and demonstrated an understanding of the instructions.   The patient was advised to call back or seek an in-person evaluation if the symptoms worsen or if the condition fails to improve as anticipated.   I provided 25 minutes of non-face-to-face time during this encounter, 15 minutes of additional time was needed to gather information, review chart, records, communicate/coordinate with staff remotely, troubleshooting the multiple errors that we get every time when trying to do video calls through the electronic medical record, WebEx, and Doximity, restart the encounter multiple times due to instability of the software, as well as complete documentation.   ___________________________________________ Gwen Her. Dianah Field, M.D., ABFM., CAQSM. Primary Care and Sports Medicine [place] [facility]  Adjunct Professor of [place] [employer] [facility] of Medicine

## 2019-06-21 NOTE — Assessment & Plan Note (Signed)
Jimmy Garcia did extremely well with a subacromial injection back in September. He had a bit of stiffness in the morning so we did an MRI that showed a bit of rotator cuff tendinopathy as expected and a glenohumeral joint effusion. Because he is doing so well and only gets an occasional twinge of pain he feels as though he can live with it, no further intervention needed, though if it comes back we will proceed with a glenohumeral injection.

## 2019-06-23 LAB — NOVEL CORONAVIRUS, NAA: SARS-CoV-2, NAA: NOT DETECTED

## 2019-07-06 ENCOUNTER — Ambulatory Visit (INDEPENDENT_AMBULATORY_CARE_PROVIDER_SITE_OTHER): Payer: BC Managed Care – PPO | Admitting: Sports Medicine

## 2019-07-06 ENCOUNTER — Other Ambulatory Visit: Payer: Self-pay

## 2019-07-06 DIAGNOSIS — M7542 Impingement syndrome of left shoulder: Secondary | ICD-10-CM

## 2019-07-06 NOTE — Progress Notes (Signed)
Subjective:    CC: Follow-up  HPI: Jimmy Garcia returns, been treating for left shoulder pain, initially impingement related, he did well after his subacromial injection back in September.  Unfortunately he developed a recurrence of pain, ultimately MRI showed a glenohumeral joint effusion and rotator cuff tendinosis, we had discussed an injection but he did have some Covid symptoms so he was sent to the testing site.  He is back here for further evaluation and definitive treatment and discuss possible injection.  Pain is moderate, persistent, localized at the joint line without radiation.  I reviewed the past medical history, family history, social history, surgical history, and allergies today and no changes were needed.  Please see the problem list section below in epic for further details.  Past Medical History: Past Medical History:  Diagnosis Date  . Allergy    Past Surgical History: Past Surgical History:  Procedure Laterality Date  . ANTERIOR CRUCIATE LIGAMENT REPAIR Right    01/2015   Social History: Social History   Socioeconomic History  . Marital status: Married    Spouse name: Jimmy Garcia  . Number of children: Not on file  . Years of education: Not on file  . Highest education level: Not on file  Occupational History  . Occupation: Education  Social Needs  . Financial resource strain: Not on file  . Food insecurity    Worry: Not on file    Inability: Not on file  . Transportation needs    Medical: Not on file    Non-medical: Not on file  Tobacco Use  . Smoking status: Never Smoker  . Smokeless tobacco: Never Used  Substance and Sexual Activity  . Alcohol use: No  . Drug use: No  . Sexual activity: Yes    Partners: Female  Lifestyle  . Physical activity    Days per week: Not on file    Minutes per session: Not on file  . Stress: Not on file  Relationships  . Social Herbalist on phone: Not on file    Gets together: Not on file    Attends  religious service: Not on file    Active member of club or organization: Not on file    Attends meetings of clubs or organizations: Not on file    Relationship status: Not on file  Other Topics Concern  . Not on file  Social History Narrative  . Not on file   Family History: Family History  Problem Relation Age of Onset  . Cancer Paternal Grandfather    Allergies: Allergies  Allergen Reactions  . Sulfa Antibiotics    Medications: See med rec.  Review of Systems: No fevers, chills, night sweats, weight loss, chest pain, or shortness of breath.   Objective:    General: Well Developed, well nourished, and in no acute distress.  Neuro: Alert and oriented x3, extra-ocular muscles intact, sensation grossly intact.  HEENT: Normocephalic, atraumatic, pupils equal round reactive to light, neck supple, no masses, no lymphadenopathy, thyroid nonpalpable.  Skin: Warm and dry, no rashes. Cardiac: Regular rate and rhythm, no murmurs rubs or gallops, no lower extremity edema.  Respiratory: Clear to auscultation bilaterally. Not using accessory muscles, speaking in full sentences.  Procedure: Real-time Ultrasound Guided injection of the left glenohumeral joint Device: GE Logiq E  Verbal informed consent obtained.  Time-out conducted.  Noted no overlying erythema, induration, or other signs of local infection.  Skin prepped in a sterile fashion.  Local anesthesia: Topical Ethyl  chloride.  With sterile technique and under real time ultrasound guidance:  1 cc Kenalog 40, 2 cc lidocaine, 2 cc bupivacaine injected easily Completed without difficulty  Pain immediately resolved suggesting accurate placement of the medication.  Advised to call if fevers/chills, erythema, induration, drainage, or persistent bleeding.  Images permanently stored and available for review in the ultrasound unit.  Impression: Technically successful ultrasound guided injection.  Impression and Recommendations:     Impingement syndrome of left shoulder Did well after a subacromial injection in September but had a recurrence of pain, MRI did show a glenohumeral joint effusion, glenohumeral joint injection today. Return to see me in 1 month.   ___________________________________________ Ihor Austin. Benjamin Stain, M.D., ABFM., CAQSM. Primary Care and Sports Medicine [facility]  Adjunct Professor of Family Medicine  [employer]

## 2019-07-06 NOTE — Assessment & Plan Note (Signed)
Did well after a subacromial injection in September but had a recurrence of pain, MRI did show a glenohumeral joint effusion, glenohumeral joint injection today. Return to see me in 1 month.

## 2019-08-11 ENCOUNTER — Encounter: Payer: Self-pay | Admitting: Sports Medicine

## 2019-08-19 ENCOUNTER — Other Ambulatory Visit: Payer: Self-pay | Admitting: Sports Medicine

## 2019-08-19 DIAGNOSIS — F524 Premature ejaculation: Secondary | ICD-10-CM

## 2019-09-01 ENCOUNTER — Ambulatory Visit: Payer: BC Managed Care – PPO | Admitting: Sports Medicine

## 2019-09-01 ENCOUNTER — Encounter: Payer: Self-pay | Admitting: Gastroenterology

## 2019-09-01 ENCOUNTER — Ambulatory Visit (INDEPENDENT_AMBULATORY_CARE_PROVIDER_SITE_OTHER): Payer: BC Managed Care – PPO

## 2019-09-01 ENCOUNTER — Other Ambulatory Visit: Payer: Self-pay

## 2019-09-01 DIAGNOSIS — T402X5A Adverse effect of other opioids, initial encounter: Secondary | ICD-10-CM | POA: Insufficient documentation

## 2019-09-01 DIAGNOSIS — K5903 Drug induced constipation: Secondary | ICD-10-CM | POA: Diagnosis not present

## 2019-09-01 DIAGNOSIS — R1013 Epigastric pain: Secondary | ICD-10-CM | POA: Diagnosis not present

## 2019-09-01 MED ORDER — PANTOPRAZOLE SODIUM 40 MG PO TBEC: 40.0000 mg | DELAYED_RELEASE_TABLET | Freq: Every day | ORAL | 3 refills | Status: DC

## 2019-09-01 NOTE — Progress Notes (Signed)
Subjective:    CC: Abdominal pain  HPI: Jimmy Garcia is a pleasant 48 year old male, he is on chronic narcotics with pain management.  He is also on Movantik to help him move his stools.  On a good week he will stool 3 times per week.  More recently he has had midepigastric pain, as well as mildly dark tarry.  Pain is moderate, persistent, localized with radiation around the left flank, nothing radiating to the groin and no hematuria, dysuria, or frequency/urgency.  No fevers, chills, food does make his pain slightly better.  I reviewed the past medical history, family history, social history, surgical history, and allergies today and no changes were needed.  Please see the problem list section below in epic for further details.  Past Medical History: Past Medical History:  Diagnosis Date  . Allergy    Past Surgical History: Past Surgical History:  Procedure Laterality Date  . ANTERIOR CRUCIATE LIGAMENT REPAIR Right    01/2015   Social History: Social History   Socioeconomic History  . Marital status: Married    Spouse name: Oval Cavazos  . Number of children: Not on file  . Years of education: Not on file  . Highest education level: Not on file  Occupational History  . Occupation: Education  Tobacco Use  . Smoking status: Never Smoker  . Smokeless tobacco: Never Used  Substance and Sexual Activity  . Alcohol use: No  . Drug use: No  . Sexual activity: Yes    Partners: Female  Other Topics Concern  . Not on file  Social History Narrative  . Not on file   Social Determinants of Health   Financial Resource Strain:   . Difficulty of Paying Living Expenses: Not on file  Food Insecurity:   . Worried About Programme researcher, broadcasting/film/video in the Last Year: Not on file  . Ran Out of Food in the Last Year: Not on file  Transportation Needs:   . Lack of Transportation (Medical): Not on file  . Lack of Transportation (Non-Medical): Not on file  Physical Activity:   . Days of Exercise  per Week: Not on file  . Minutes of Exercise per Session: Not on file  Stress:   . Feeling of Stress : Not on file  Social Connections:   . Frequency of Communication with Friends and Family: Not on file  . Frequency of Social Gatherings with Friends and Family: Not on file  . Attends Religious Services: Not on file  . Active Member of Clubs or Organizations: Not on file  . Attends Banker Meetings: Not on file  . Marital Status: Not on file   Family History: Family History  Problem Relation Age of Onset  . Cancer Paternal Grandfather    Allergies: Allergies  Allergen Reactions  . Sulfa Antibiotics    Medications: See med rec.  Review of Systems: No fevers, chills, night sweats, weight loss, chest pain, or shortness of breath.   Objective:    General: Well Developed, well nourished, and in no acute distress.  Neuro: Alert and oriented x3, extra-ocular muscles intact, sensation grossly intact.  HEENT: Normocephalic, atraumatic, pupils equal round reactive to light, neck supple, no masses, no lymphadenopathy, thyroid nonpalpable.  Skin: Warm and dry, no rashes. Cardiac: Regular rate and rhythm, no murmurs rubs or gallops, no lower extremity edema.  Respiratory: Clear to auscultation bilaterally. Not using accessory muscles, speaking in full sentences. Abdomen: Soft, minimally distended, nontender, normal bowel sounds, no palpable masses,  no guarding, rigidity, rebound tenderness.  Impression and Recommendations:    Constipation due to opioid therapy Currently on Movantik, adding an abdominal x-ray, if significant stool burden we will treat aggressively with fleets Phospho-Soda and 2 glasses of water. He is currently doing MiraLAX, occasional Colace without much improvement.  Midepigastric pain With occasional tarry stools. Adding pantoprazole twice a day, referral to gastroenterology, I do think he is going to need a scope.     ___________________________________________ Gwen Her. Dianah Field, M.D., ABFM., CAQSM. Primary Care and Sports Medicine [facility]  Adjunct Professor of [employer]

## 2019-09-01 NOTE — Assessment & Plan Note (Signed)
Currently on Movantik, adding an abdominal x-ray, if significant stool burden we will treat aggressively with fleets Phospho-Soda and 2 glasses of water. He is currently doing MiraLAX, occasional Colace without much improvement.

## 2019-09-01 NOTE — Assessment & Plan Note (Signed)
With occasional tarry stools. Adding pantoprazole twice a day, referral to gastroenterology, I do think he is going to need a scope.

## 2019-09-22 ENCOUNTER — Encounter: Payer: Self-pay | Admitting: Sports Medicine

## 2019-09-22 ENCOUNTER — Other Ambulatory Visit: Payer: Self-pay

## 2019-09-22 ENCOUNTER — Ambulatory Visit (INDEPENDENT_AMBULATORY_CARE_PROVIDER_SITE_OTHER): Payer: BC Managed Care – PPO | Admitting: Sports Medicine

## 2019-09-22 DIAGNOSIS — T402X5A Adverse effect of other opioids, initial encounter: Secondary | ICD-10-CM

## 2019-09-22 DIAGNOSIS — K5903 Drug induced constipation: Secondary | ICD-10-CM | POA: Diagnosis not present

## 2019-09-22 DIAGNOSIS — R1013 Epigastric pain: Secondary | ICD-10-CM

## 2019-09-22 DIAGNOSIS — Z0000 Encounter for general adult medical examination without abnormal findings: Secondary | ICD-10-CM

## 2019-09-22 LAB — POCT URINALYSIS DIP (CLINITEK)
Bilirubin, UA: NEGATIVE
Blood, UA: NEGATIVE
Glucose, UA: NEGATIVE mg/dL
Ketones, POC UA: NEGATIVE mg/dL
Leukocytes, UA: NEGATIVE
Nitrite, UA: NEGATIVE
POC PROTEIN,UA: NEGATIVE
Spec Grav, UA: 1.01 (ref 1.010–1.025)
Urobilinogen, UA: 0.2 E.U./dL
pH, UA: 6 (ref 5.0–8.0)

## 2019-09-22 NOTE — Progress Notes (Signed)
    Procedures performed today:    None.  Independent interpretation of tests performed by another provider:   None.  Impression and Recommendations:    Constipation due to opioid therapy Le returns, he is on Movantik due to chronic opioid therapy. He has significant stool burden that was not relieved with over-the-counter treatment so we added fleets Phospho-Soda, [place], [place]. Symptoms including left-sided abdominal pain have now resolved.  Midepigastric pain At the last visit Kuper was also having midepigastric pain with occasional tarry stools, I added pantoprazole twice a day, he has not yet seen the gastroenterologist but does have an appointment coming up. This has all resolved as well with his pantoprazole, I would still like Dr. Frankey Shown opinion.  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    ___________________________________________ Ihor Austin. Benjamin Stain, M.D., ABFM., CAQSM. Primary Care and Sports Medicine [facility]  Adjunct Instructor of Family Medicine  [employer]

## 2019-09-22 NOTE — Assessment & Plan Note (Signed)
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

## 2019-09-22 NOTE — Assessment & Plan Note (Signed)
Jimmy Garcia returns, he is on Movantik due to chronic opioid therapy. He has significant stool burden that was not relieved with over-the-counter treatment so we added fleets Phospho-Soda, [place], [place]. Symptoms including left-sided abdominal pain have now resolved.

## 2019-09-22 NOTE — Addendum Note (Signed)
Addended by: Juel Burrow on: 09/22/2019 04:51 PM   Modules accepted: Orders

## 2019-09-22 NOTE — Assessment & Plan Note (Signed)
At the last visit Jimmy Garcia was also having midepigastric pain with occasional tarry stools, I added pantoprazole twice a day, he has not yet seen the gastroenterologist but does have an appointment coming up. This has all resolved as well with his pantoprazole, I would still like Dr. Frankey Shown opinion.

## 2019-09-28 ENCOUNTER — Encounter: Payer: Self-pay | Admitting: Gastroenterology

## 2019-09-28 ENCOUNTER — Ambulatory Visit: Payer: BC Managed Care – PPO | Admitting: Gastroenterology

## 2019-09-28 ENCOUNTER — Other Ambulatory Visit: Payer: Self-pay

## 2019-09-28 VITALS — BP 122/82 | HR 64 | Temp 97.1°F | Ht 69.0 in | Wt 216.0 lb

## 2019-09-28 DIAGNOSIS — Z01818 Encounter for other preprocedural examination: Secondary | ICD-10-CM

## 2019-09-28 DIAGNOSIS — K5900 Constipation, unspecified: Secondary | ICD-10-CM

## 2019-09-28 DIAGNOSIS — R12 Heartburn: Secondary | ICD-10-CM | POA: Diagnosis not present

## 2019-09-28 DIAGNOSIS — Z800 Family history of malignant neoplasm of digestive organs: Secondary | ICD-10-CM

## 2019-09-28 DIAGNOSIS — K921 Melena: Secondary | ICD-10-CM | POA: Diagnosis not present

## 2019-09-28 DIAGNOSIS — R1012 Left upper quadrant pain: Secondary | ICD-10-CM

## 2019-09-28 DIAGNOSIS — Z86010 Personal history of colonic polyps: Secondary | ICD-10-CM

## 2019-09-28 MED ORDER — CLENPIQ 10-3.5-12 MG-GM -GM/160ML PO SOLN: 1.0000 | Freq: Once | ORAL | 0 refills | Status: AC

## 2019-09-28 NOTE — Progress Notes (Addendum)
Chief Complaint: Constipation, abdominal pain, reflux symptoms  Referring Provider:     Silverio Decamp, MD   HPI:     Jimmy Garcia is a 49 y.o. male referred to the Gastroenterology Clinic for evaluation of acute on chronic constipation, abdominal pain, GI blood loss.  Chronic pain medications (oxycodone) for chronic left shoulder pain. Started Movantik a few years ago as a replacement for dulcolax. Has 3-4 stools/week for multiple years as baseline.  Constipation worsened in 08/2019, treated with fleets Phospho-Soda, [place], [place].   Additionally, he c/o left-sided abdominal pain, worst in the LUQ.  No change with heating pad.  Worse with p.o. intake, independent of food type.  Pain has improved, but not sure if this is due to the above laxative regimen or starting PPI therapy as outlined below.     Did have reflux sxs during this episode (HB, waterbrash, regurgitation), improved with prn Tums. Separately, he c/o epigastric pain and occasional melenic stool.  Was started on pantoprazole 40 mg bid, with improvement of UGI symptoms. No n/v.   Lastly, he does report occasional BRB on tissue paper.   Hx of perirectal pilonidal cyst 30+ years ago requiring I&D.  No recurrence.  Abdominal x-ray 08/2019: Nonspecific, nonobstructive bowel gas pattern with mild gaseous distention of stomach and left colon.  Normal CBC, TSH, and CMP in 04/2019.  Colonoscopy age 41 (normal) and 85 (polyps) at outside facilities.  Unsure of recall, but thinks 10 years.  No endoscopy reports or path reports were reviewed in EMR.  PGF with CRC.  No FCR with GI malignancy, IBD.   Past Medical History:  Diagnosis Date  . Allergy      Past Surgical History:  Procedure Laterality Date  . ANTERIOR CRUCIATE LIGAMENT REPAIR Right    01/2015  . COLONOSCOPY     first one at age 36 but then age 21 possibly removed a polyp in [facility]. Last one done 2016    Family History  Problem Relation Age of Onset  . Colon cancer Paternal Grandfather   . Colon polyps Paternal Grandfather   . Esophageal cancer Neg Hx    Social History   Tobacco Use  . Smoking status: Never Smoker  . Smokeless tobacco: Never Used  Substance Use Topics  . Alcohol use: No  . Drug use: No   Current Outpatient Medications  Medication Sig Dispense Refill  . albuterol (PROVENTIL HFA;VENTOLIN HFA) 108 (90 Base) MCG/ACT inhaler Inhale 1-2 puffs into the lungs every 6 (six) hours as needed for wheezing or shortness of breath. 8.5 Inhaler 0  . docusate sodium (COLACE) 100 MG capsule Take 200 mg by mouth daily.    Marland Kitchen ipratropium (ATROVENT) 0.06 % nasal spray PLACE 1 SPRAY INTO BOTH NOSTRILS 4 (FOUR) TIMES DAILY AS NEEDED. 15 mL 3  . levocetirizine (XYZAL) 5 MG tablet TAKE 1 TABLET BY MOUTH EVERY DAY IN THE EVENING 90 tablet 3  . meloxicam (MOBIC) 15 MG tablet ONE TABLET BY MOUTH EVERY MORNING WITH BREAKFAST AS NEEDED FOR PAIN. 90 tablet 1  . montelukast (SINGULAIR) 10 MG tablet TAKE 1 TABLET BY MOUTH EVERYDAY AT BEDTIME 90 tablet 3  . naloxegol oxalate (MOVANTIK) 25 MG TABS tablet Take 25 mg by mouth daily.    . pantoprazole (PROTONIX) 40 MG tablet Take 1 tablet (40 mg total) by mouth daily. 30 tablet 3  . sertraline (ZOLOFT) 100 MG tablet  TAKE 1 TABLET BY MOUTH EVERY DAY 90 tablet 2  . topiramate (TOPAMAX) 100 MG tablet Take 1 tablet (100 mg total) by mouth 2 (two) times daily. 180 tablet 3  . XTAMPZA ER 18 MG C12A TAKE ONE CAPSULE EVERY 12 HOURS  0  . Sod Picosulfate-Mag Ox-Cit Acd (CLENPIQ) 10-3.5-12 MG-GM -GM/160ML SOLN Take 1 kit by mouth once for 1 dose. 320 mL 0   No current facility-administered medications for this visit.   Allergies  Allergen Reactions  . Sulfa Antibiotics      Review of Systems: All systems reviewed and negative except where noted in HPI.     Physical Exam:    Wt Readings from Last 3 Encounters:  09/28/19 216 lb (98 kg)  09/22/19 216  lb (98 kg)  09/01/19 208 lb (94.3 kg)    BP 122/82   Pulse 64   Temp (!) 97.1 F (36.2 C)   Ht 5' 9"  (1.753 m)   Wt 216 lb (98 kg)   BMI 31.90 kg/m  Constitutional:  Pleasant, in no acute distress. Psychiatric: Normal mood and affect. Behavior is normal. EENT: Pupils normal.  Conjunctivae are normal. No scleral icterus. Neck supple. No cervical LAD. Cardiovascular: Normal rate, regular rhythm. No edema Pulmonary/chest: Effort normal and breath sounds normal. No wheezing, rales or rhonchi. Abdominal: Soft, nondistended, nontender. Bowel sounds active throughout. There are no masses palpable. No hepatomegaly. Neurological: Alert and oriented to person place and time. Skin: Skin is warm and dry. No rashes noted.   ASSESSMENT AND PLAN;   1) LUQ pain 2) Reflux symptoms 3) Melena  - EGD to evaluate for PUD, gastritis, reflux changes, with gastric biopsies -Resume Protonix for now  4) Constipation 5) Hematochezia 6) Abnormal abdominal x-ray -Colonoscopy to evaluate for mucosal/luminal pathology -Evaluate for internal hemorrhoids at time of colonoscopy -History of pilonidal cyst 30+ years ago requiring I&D.  Can certainly evaluate for luminal deformity, subtle IBD, etc. at time of colonoscopy -Continue Movantik for OIC with Colace as needed  7) Family history of colon cancer 8) Personal history of colon polyps -Colonoscopy 6+ years ago with polyps of unknown size, location, number, histology -Paternal grandfather with CRC.  He has had colonoscopy x2 for early screening.  Ongoing screening/surveillance interval pending colonoscopy findings as above  The indications, risks, and benefits of EGD and colonoscopy were explained to the patient in detail. Risks include but are not limited to bleeding, perforation, adverse reaction to medications, and cardiopulmonary compromise. Sequelae include but are not limited to the possibility of surgery, hositalization, and mortality. The patient  verbalized understanding and wished to proceed. All questions answered, referred to scheduler and bowel prep ordered. Further recommendations pending results of the exam.    Lavena Bullion, DO, FACG  09/28/2019, 4:39 PM   Silverio Decamp,*  Addendum: Received colonoscopy and pathology report from 04/06/2015, completed by Dr. Shirleen Schirmer at [facility] and notable for 2 mm tubular adenoma with recommendation to repeat in 5 years.  Per report, short interval due to prep quality.  Given previous colonoscopy with a single subcentimeter tubular adenoma, per guideline recommendations at that time, repeat interval in 5 years appropriate.  Additionally, previous Endoscopist also recommended 5 years.  Planning on repeat colonoscopy as outlined above.

## 2019-09-28 NOTE — Patient Instructions (Signed)
You have been scheduled for an endoscopy and colonoscopy. Please follow the written instructions given to you at your visit today. Please pick up your prep supplies at the pharmacy within the next 1-3 days. If you use inhalers (even only as needed), please bring them with you on the day of your procedure. Your physician has requested that you go to www.startemmi.com and enter the access code given to you at your visit today. This web site gives a general overview about your procedure. However, you should still follow specific instructions given to you by our office regarding your preparation for the procedure.  It was a pleasure to see you today!  Vito Cirigliano, D.O.  

## 2019-09-29 ENCOUNTER — Encounter: Payer: BC Managed Care – PPO | Admitting: Sports Medicine

## 2019-09-29 ENCOUNTER — Telehealth: Payer: Self-pay | Admitting: Gastroenterology

## 2019-09-29 ENCOUNTER — Ambulatory Visit: Payer: BC Managed Care – PPO | Admitting: Sports Medicine

## 2019-09-29 NOTE — Telephone Encounter (Signed)
Spoke to patient. He will come by the [facility] office to pick up a sample of clinpiq on 09/30/19

## 2019-10-06 ENCOUNTER — Encounter: Payer: Self-pay | Admitting: Gastroenterology

## 2019-10-15 ENCOUNTER — Ambulatory Visit (INDEPENDENT_AMBULATORY_CARE_PROVIDER_SITE_OTHER): Payer: BC Managed Care – PPO

## 2019-10-15 ENCOUNTER — Other Ambulatory Visit: Payer: Self-pay

## 2019-10-15 ENCOUNTER — Other Ambulatory Visit: Payer: Self-pay | Admitting: Gastroenterology

## 2019-10-15 DIAGNOSIS — Z1159 Encounter for screening for other viral diseases: Secondary | ICD-10-CM

## 2019-10-16 LAB — SARS CORONAVIRUS 2 (TAT 6-24 HRS): SARS Coronavirus 2: NEGATIVE

## 2019-10-18 ENCOUNTER — Other Ambulatory Visit: Payer: Self-pay | Admitting: Sports Medicine

## 2019-10-18 DIAGNOSIS — G8929 Other chronic pain: Secondary | ICD-10-CM

## 2019-10-18 DIAGNOSIS — M25511 Pain in right shoulder: Secondary | ICD-10-CM

## 2019-10-18 MED ORDER — MELOXICAM 15 MG PO TABS: 15.0000 mg | ORAL_TABLET | Freq: Every day | ORAL | 1 refills | Status: DC

## 2019-10-19 ENCOUNTER — Ambulatory Visit (AMBULATORY_SURGERY_CENTER): Payer: BC Managed Care – PPO | Admitting: Gastroenterology

## 2019-10-19 ENCOUNTER — Other Ambulatory Visit: Payer: Self-pay

## 2019-10-19 ENCOUNTER — Encounter: Payer: Self-pay | Admitting: Gastroenterology

## 2019-10-19 VITALS — BP 103/68 | HR 45 | Temp 97.3°F | Resp 15 | Ht 69.0 in | Wt 216.0 lb

## 2019-10-19 DIAGNOSIS — K640 First degree hemorrhoids: Secondary | ICD-10-CM | POA: Diagnosis not present

## 2019-10-19 DIAGNOSIS — K5900 Constipation, unspecified: Secondary | ICD-10-CM | POA: Diagnosis not present

## 2019-10-19 DIAGNOSIS — R1012 Left upper quadrant pain: Secondary | ICD-10-CM

## 2019-10-19 DIAGNOSIS — K922 Gastrointestinal hemorrhage, unspecified: Secondary | ICD-10-CM

## 2019-10-19 DIAGNOSIS — K2970 Gastritis, unspecified, without bleeding: Secondary | ICD-10-CM

## 2019-10-19 DIAGNOSIS — R12 Heartburn: Secondary | ICD-10-CM

## 2019-10-19 DIAGNOSIS — K3189 Other diseases of stomach and duodenum: Secondary | ICD-10-CM | POA: Diagnosis not present

## 2019-10-19 DIAGNOSIS — K2980 Duodenitis without bleeding: Secondary | ICD-10-CM | POA: Diagnosis not present

## 2019-10-19 DIAGNOSIS — K269 Duodenal ulcer, unspecified as acute or chronic, without hemorrhage or perforation: Secondary | ICD-10-CM

## 2019-10-19 DIAGNOSIS — Z86010 Personal history of colonic polyps: Secondary | ICD-10-CM

## 2019-10-19 DIAGNOSIS — K921 Melena: Secondary | ICD-10-CM

## 2019-10-19 MED ORDER — PANTOPRAZOLE SODIUM 40 MG PO TBEC: 40.0000 mg | DELAYED_RELEASE_TABLET | Freq: Two times a day (BID) | ORAL | 0 refills | Status: DC

## 2019-10-19 MED ORDER — SODIUM CHLORIDE 0.9 % IV SOLN: 500.0000 mL | Freq: Once | INTRAVENOUS | Status: DC

## 2019-10-19 NOTE — Op Note (Signed)
Endoscopy Center Patient Name: Jimmy Garcia Procedure Date: 10/19/2019 2:08 PM MRN: [id number] Endoscopist: Doristine Locks , MD Age: 49 Referring MD:  Date of Birth: [birth year] Gender: Male Account #: [id number] Procedure:                Colonoscopy Indications:              Hematochezia, Constipation                           History of colon polyps on colonsocopy 6 years ago                            at outside facility (unknown size, histology,                            location). History of chronic constipation with                            recent exacerbation. Medicines:                Monitored Anesthesia Care Procedure:                Pre-Anesthesia Assessment:                           - Prior to the procedure, a History and Physical                            was performed, and patient medications and                            allergies were reviewed. The patient's tolerance of                            previous anesthesia was also reviewed. The risks                            and benefits of the procedure and the sedation                            options and risks were discussed with the patient.                            All questions were answered, and informed consent                            was obtained. Prior Anticoagulants: The patient has                            taken no previous anticoagulant or antiplatelet                            agents. ASA Grade Assessment: II - A patient with  mild systemic disease. After reviewing the risks                            and benefits, the patient was deemed in                            satisfactory condition to undergo the procedure.                           After obtaining informed consent, the colonoscope                            was passed under direct vision. Throughout the                            procedure, the patient's blood pressure, pulse, and         oxygen saturations were monitored continuously. The                            Colonoscope was introduced through the anus and                            advanced to the the cecum, identified by                            appendiceal orifice and ileocecal valve. The                            colonoscopy was performed without difficulty. The                            patient tolerated the procedure well. The quality                            of the bowel preparation was adequate. The                            ileocecal valve, appendiceal orifice, and rectum                            were photographed. Scope In: 2:31:47 PM Scope Out: 2:47:46 PM Scope Withdrawal Time: 0 hours 7 minutes 56 seconds  Total Procedure Duration: 0 hours 15 minutes 59 seconds  Findings:                 The perianal and digital rectal examinations were                            normal.                           The colon appeared normal. No areas of luminal                            narrowing, stricture, or ulcers.  Non-bleeding internal hemorrhoids were found during                            anoscopy. The hemorrhoids were small. Complications:            No immediate complications. Estimated Blood Loss:     Estimated blood loss: none. Impression:               - The entire examined colon is normal.                           - Non-bleeding internal hemorrhoids.                           - No specimens collected. Recommendation:           - Patient has a contact number available for                            emergencies. The signs and symptoms of potential                            delayed complications were discussed with the                            patient. Return to normal activities tomorrow.                            Written discharge instructions were provided to the                            patient.                           - Resume previous diet.                            - Continue present medications.                           - Repeat colonoscopy in 5-10 years for                            screening/surveillance purposes. Pending review of                            prior colonoscopy with review of histology could                            better dictate when repeat colonoscopy should be                            performed based on current societal surveillance                            guidelines.                           -  Return to GI clinic. Doristine Locks, MD 10/19/2019 3:00:56 PM

## 2019-10-19 NOTE — Patient Instructions (Signed)
Discharge instructions given. Handouts on Gastritis and Hemorrhoids. Prescription sent to pharmacy. Resume previous medications. YOU HAD AN ENDOSCOPIC PROCEDURE TODAY AT THE [facility]:   Refer to the procedure report that was given to you for any specific questions about what was found during the examination.  If the procedure report does not answer your questions, please call your gastroenterologist to clarify.  If you requested that your care partner not be given the details of your procedure findings, then the procedure report has been included in a sealed envelope for you to review at your convenience later.  YOU SHOULD EXPECT: Some feelings of bloating in the abdomen. Passage of more gas than usual.  Walking can help get rid of the air that was put into your GI tract during the procedure and reduce the bloating. If you had a lower endoscopy (such as a colonoscopy or flexible sigmoidoscopy) you may notice spotting of blood in your stool or on the toilet paper. If you underwent a bowel prep for your procedure, you may not have a normal bowel movement for a few days.  Please Note:  You might notice some irritation and congestion in your nose or some drainage.  This is from the oxygen used during your procedure.  There is no need for concern and it should clear up in a day or so.  SYMPTOMS TO REPORT IMMEDIATELY:   Following lower endoscopy (colonoscopy or flexible sigmoidoscopy):  Excessive amounts of blood in the stool  Significant tenderness or worsening of abdominal pains  Swelling of the abdomen that is new, acute  Fever of 100F or higher   Following upper endoscopy (EGD)  Vomiting of blood or coffee ground material  New chest pain or pain under the shoulder blades  Painful or persistently difficult swallowing  New shortness of breath  Fever of 100F or higher  Black, tarry-looking stools  For urgent or emergent issues, a gastroenterologist can be reached at any hour  by calling [phone].   DIET:  We do recommend a small meal at first, but then you may proceed to your regular diet.  Drink plenty of fluids but you should avoid alcoholic beverages for 24 hours.  ACTIVITY:  You should plan to take it easy for the rest of today and you should NOT DRIVE or use heavy machinery until tomorrow (because of the sedation medicines used during the test).    FOLLOW UP: Our staff will call the number listed on your records 48-72 hours following your procedure to check on you and address any questions or concerns that you may have regarding the information given to you following your procedure. If we do not reach you, we will leave a message.  We will attempt to reach you two times.  During this call, we will ask if you have developed any symptoms of COVID 19. If you develop any symptoms (ie: fever, flu-like symptoms, shortness of breath, cough etc.) before then, please call [phone].  If you test positive for Covid 19 in the 2 weeks post procedure, please call and report this information to [place].    If any biopsies were taken you will be contacted by phone or by letter within the next 1-3 weeks.  Please call us at [phone] if you have not heard about the biopsies in 3 weeks.    SIGNATURES/CONFIDENTIALITY: You and/or your care partner have signed paperwork which will be entered into your electronic medical record.  These signatures attest to the fact  that that the information above on your After Visit Summary has been reviewed and is understood.  Full responsibility of the confidentiality of this discharge information lies with you and/or your care-partner.

## 2019-10-19 NOTE — Progress Notes (Signed)
To PACU, VSS. Report to [email or web address] 

## 2019-10-19 NOTE — Op Note (Signed)
[facility] Patient Name: Jimmy Garcia Procedure Date: 10/19/2019 2:09 PM MRN: [id number] Endoscopist: Doristine Locks , MD Age: 49 Referring MD:  Date of Birth: 1971/06/11 Gender: Male Account #: [id number] Procedure:                Upper GI endoscopy Indications:              Epigastric abdominal pain, Abdominal pain in the                            left upper quadrant, Heartburn, Hematochezia Medicines:                Monitored Anesthesia Care Procedure:                Pre-Anesthesia Assessment:                           - Prior to the procedure, a History and Physical                            was performed, and patient medications and                            allergies were reviewed. The patient's tolerance of                            previous anesthesia was also reviewed. The risks                            and benefits of the procedure and the sedation                            options and risks were discussed with the patient.                            All questions were answered, and informed consent                            was obtained. Prior Anticoagulants: The patient has                            taken no previous anticoagulant or antiplatelet                            agents. ASA Grade Assessment: II - A patient with                            mild systemic disease. After reviewing the risks                            and benefits, the patient was deemed in                            satisfactory condition to undergo the procedure.  After obtaining informed consent, the endoscope was                            passed under direct vision. Throughout the                            procedure, the patient's blood pressure, pulse, and                            oxygen saturations were monitored continuously. The                            Endoscope was introduced through the mouth, and                            advanced to  the second part of duodenum. The upper                            GI endoscopy was accomplished without difficulty.                            The patient tolerated the procedure well. Scope In: Scope Out: Findings:                 The examined esophagus was normal.                           The Z-line was regular and was found 40 cm from the                            incisors.                           Scattered minimal inflammation characterized by                            erythema was found in the gastric body and in the                            gastric antrum. Biopsies were taken with a cold                            forceps for Helicobacter pylori testing. Estimated                            blood loss was minimal.                           One non-bleeding superficial duodenal ulcer with no                            stigmata of bleeding was found in the duodenal                            bulb. The lesion  was 4 mm in largest dimension.                            Biopsies were taken with a cold forceps for                            histology. Estimated blood loss was minimal.                           The second portion of the duodenum was normal. Complications:            No immediate complications. Estimated Blood Loss:     Estimated blood loss was minimal. Impression:               - Normal esophagus.                           - Z-line regular, 40 cm from the incisors.                           - Gastritis. Biopsied.                           - Non-bleeding duodenal ulcer with no stigmata of                            bleeding. This appeared to be a healing, linear,                            superficial ulcer. This was biopsied.                           - Normal second portion of the duodenum. Recommendation:           - Patient has a contact number available for                            emergencies. The signs and symptoms of potential                             delayed complications were discussed with the                            patient. Return to normal activities tomorrow.                            Written discharge instructions were provided to the                            patient.                           - Resume previous diet.                           - Continue present medications.                           -  Await pathology results.                           - Increase Protonix (pantoprazole) to 40 mg PO BID                            for 6 weeks to promote further ulcer healing, then                            resume at 40 mg once per day with plan to titrate                            to lowest effective dose for control of reflux                            symptoms.                           - Perform a colonoscopy today. Doristine Locks, MD 10/19/2019 2:55:27 PM

## 2019-10-19 NOTE — Progress Notes (Signed)
Called to room to assist during endoscopic procedure.  Patient ID and intended procedure confirmed with present staff. Received instructions for my participation in the procedure from the performing physician.  

## 2019-10-19 NOTE — Progress Notes (Signed)
Temp by LC Vitals by [place] 

## 2019-10-22 ENCOUNTER — Telehealth: Payer: Self-pay | Admitting: *Deleted

## 2019-10-22 NOTE — Telephone Encounter (Signed)
  Follow up Call-  Call back number 10/19/2019  Post procedure Call Back phone  # [phone]  Permission to leave phone message Yes  Some recent data might be hidden     Patient questions:  Do you have a fever, pain , or abdominal swelling? No. Pain Score  0 *  Have you tolerated food without any problems? Yes.    Have you been able to return to your normal activities? Yes.    Do you have any questions about your discharge instructions: Diet   No. Medications  No. Follow up visit  No.  Do you have questions or concerns about your Care? No.  Actions: * If pain score is 4 or above: No action needed, pain <4.   1. Have you developed a fever since your procedure? no  2.   Have you had an respiratory symptoms (SOB or cough) since your procedure? no  3.   Have you tested positive for COVID 19 since your procedure no  4.   Have you had any family members/close contacts diagnosed with the COVID 19 since your procedure?  no   If yes to any of these questions please route to Laverna Peace, RN and Jennye Boroughs, Charity fundraiser.

## 2019-11-04 ENCOUNTER — Encounter: Payer: Self-pay | Admitting: Gastroenterology

## 2019-11-05 ENCOUNTER — Telehealth: Payer: Self-pay

## 2019-11-05 NOTE — Telephone Encounter (Addendum)
Left message for patient to call back to the office;   4 wk f/u post EGD/colon-epi abd pain/LUQ abd pain/heartburn/hematochezia/constipation/[place]

## 2019-11-05 NOTE — Telephone Encounter (Signed)
Patient returned your call and per your note I scheduled a follow up appt with Dr. Barron Alvine

## 2019-11-23 ENCOUNTER — Encounter: Payer: Self-pay | Admitting: Gastroenterology

## 2019-11-23 ENCOUNTER — Other Ambulatory Visit: Payer: Self-pay

## 2019-11-23 ENCOUNTER — Ambulatory Visit: Payer: BC Managed Care – PPO | Admitting: Gastroenterology

## 2019-11-23 ENCOUNTER — Other Ambulatory Visit: Payer: Self-pay | Admitting: Sports Medicine

## 2019-11-23 VITALS — BP 126/74 | HR 61 | Temp 96.9°F | Ht 69.0 in | Wt 220.0 lb

## 2019-11-23 DIAGNOSIS — K5900 Constipation, unspecified: Secondary | ICD-10-CM | POA: Diagnosis not present

## 2019-11-23 DIAGNOSIS — K640 First degree hemorrhoids: Secondary | ICD-10-CM

## 2019-11-23 DIAGNOSIS — R1012 Left upper quadrant pain: Secondary | ICD-10-CM | POA: Diagnosis not present

## 2019-11-23 DIAGNOSIS — K219 Gastro-esophageal reflux disease without esophagitis: Secondary | ICD-10-CM

## 2019-11-23 DIAGNOSIS — R1013 Epigastric pain: Secondary | ICD-10-CM

## 2019-11-23 DIAGNOSIS — K2970 Gastritis, unspecified, without bleeding: Secondary | ICD-10-CM

## 2019-11-23 DIAGNOSIS — K2990 Gastroduodenitis, unspecified, without bleeding: Secondary | ICD-10-CM

## 2019-11-23 NOTE — Patient Instructions (Addendum)
Follow up as needed. Call if you have any questions.

## 2019-11-23 NOTE — Progress Notes (Signed)
P  Chief Complaint:    Constipation, reflux, abdominal pain  GI History: 49 year old male comes in the GI clinic for the following:  1) Acute on chronic constipation: Chronic pain medications (oxycodone) for chronic left shoulder pain. Started Movantik a few years ago as a replacement for dulcolax. Has 3-4 stools/week for multiple years as baseline.  Constipation worsened in 08/2019, treated with fleets Phospho-Soda, [place], [employer].  Symptoms have largely improved.  2) Abdominal pain: left-sided abdominal pain, worst in the LUQ.  No change with heating pad.  Worse with p.o. intake, independent of food type.  Pain has essentially resolved with PPI and treatment of underlying constipation  3) GERD: Recent onset reflux symptoms (HB, waterbrash, regurgitation), improved with prn Tums. Separately, he c/o epigastric pain and occasional dark stool.  Was started on pantoprazole 40 mg bid, with improvement of UGI symptoms.  4) Hematochezia/internal hemorrhoids: Occasional BRB on tissue paper  History of pilonidal cyst 30+ years ago requiring I&D.  No recurrence.  Evaluation to date: -Abdominal x-ray 08/2019: Nonspecific, nonobstructive bowel gas pattern with mild gaseous distention of stomach and left colon.   -Normal CBC, TSH, and CMP in 04/2019.  Endoscopic history: -EGD (10/2019, Dr. Bryan Lemma): Minimal gastritis, superficial duodenal bulb ulcer (biopsy benign) -Colonoscopy (10/2019, Dr. Bryan Lemma): Normal.  Internal hemorrhoids. -Colonoscopy age 42 (normal) and 52 (polyps) at outside facilities.  Unsure of recall, but thinks 10 years.  No endoscopy reports or path reports were reviewed in EMR.  PGF with CRC.  No FCR with GI malignancy, IBD.  HPI:     Patient is a 49 y.o. male presenting to the Gastroenterology Clinic for follow-up.  Initially seen by me in 09/28/2019.  Since then, evaluated with EGD/colonoscopy as outlined above.  Increased Protonix to 40 mg bid for mild gastritis and  duodenal bulb ulcer x6 weeks, with plan to reduce to 40 mg/daily to control reflux.  Today, he states sxs have largely improved. Still taking Colace and Movantik as prescribed. Occasional straining to have BM, but much improved.  No longer with abdominal pain.  Reflux symptoms well controlled on current high-dose PPI.  He is otherwise without any complaints today, and maintaining active lifestyle.  No new labs or imaging review today other than outlined above.    Review of systems:     No chest pain, no SOB, no fevers, no urinary sx   Past Medical History:  Diagnosis Date  . Allergy   . Arthritis   . GERD (gastroesophageal reflux disease)     Patient's surgical history, family medical history, social history, medications and allergies were all reviewed in Epic    Current Outpatient Medications  Medication Sig Dispense Refill  . albuterol (PROVENTIL HFA;VENTOLIN HFA) 108 (90 Base) MCG/ACT inhaler Inhale 1-2 puffs into the lungs every 6 (six) hours as needed for wheezing or shortness of breath. 8.5 Inhaler 0  . docusate sodium (COLACE) 100 MG capsule Take 200 mg by mouth daily.    Marland Kitchen ipratropium (ATROVENT) 0.06 % nasal spray PLACE 1 SPRAY INTO BOTH NOSTRILS 4 (FOUR) TIMES DAILY AS NEEDED. 15 mL 3  . levocetirizine (XYZAL) 5 MG tablet TAKE 1 TABLET BY MOUTH EVERY DAY IN THE EVENING 90 tablet 3  . meloxicam (MOBIC) 15 MG tablet Take 1 tablet (15 mg total) by mouth daily. With a meal 90 tablet 1  . montelukast (SINGULAIR) 10 MG tablet TAKE 1 TABLET BY MOUTH EVERYDAY AT BEDTIME 90 tablet 3  . naloxegol oxalate (MOVANTIK) 25 MG  TABS tablet Take 25 mg by mouth daily.    . pantoprazole (PROTONIX) 40 MG tablet Take 1 tablet (40 mg total) by mouth 2 (two) times daily. 90 tablet 0  . sertraline (ZOLOFT) 100 MG tablet TAKE 1 TABLET BY MOUTH EVERY DAY 90 tablet 2  . topiramate (TOPAMAX) 100 MG tablet Take 1 tablet (100 mg total) by mouth 2 (two) times daily. 180 tablet 3  . XTAMPZA ER 18 MG C12A  TAKE ONE CAPSULE EVERY 12 HOURS  0  . pantoprazole (PROTONIX) 40 MG tablet TAKE 1 TABLET BY MOUTH EVERY DAY (Patient not taking: Reported on 11/23/2019) 90 tablet 1   No current facility-administered medications for this visit.    Physical Exam:     BP 126/74   Pulse 61   Temp (!) 96.9 F (36.1 C)   Ht 5\' 9"  (1.753 m)   Wt 220 lb (99.8 kg)   BMI 32.49 kg/m   GENERAL:  Pleasant male in NAD PSYCH: : Cooperative, normal affect EENT:  conjunctiva pink, mucous membranes moist, neck supple without masses CARDIAC:  RRR, no murmur heard, no peripheral edema PULM: Normal respiratory effort, lungs CTA bilaterally, no wheezing ABDOMEN:  Nondistended, soft, nontender. No obvious masses, no hepatomegaly,  normal bowel sounds SKIN:  turgor, no lesions seen Musculoskeletal:  Normal muscle tone, normal strength NEURO: Alert and oriented x 3, no focal neurologic deficits   IMPRESSION and PLAN:    1) Chronic constipation: Chronic constipation likely 2/2 chronic pain medications for shoulder.  Recent exacerbation, now much improved on current med regimen -Continue current Colace and Movantik -Continue adequate hydration and active lifestyle -Continue high-fiber diet -Good recurrence of exacerbation, can again treat with short course of laxative as needed  2) GERD: -Well-controlled on current therapy -Complete 6-week course of high-dose PPI, then titrate down to Protonix 40 mg/day and continue to titrate to lowest effective dose or potentially off completely if symptoms still well controlled -Continue antireflux lifestyle/dietary modifications  3) Abdominal pain: -Improved with treatment of the underlying issues above.  No additional work-up needed at this time  4) Internal hemorrhoids: -Small internal and started on colonoscopy.  Intermittently mildly symptomatic.  Treatment of underlying constipation as above  5) Gastritis/Duodenitis: -Complete 6-week course of high-dose PPI therapy as  above -Biopsies negative for H. pylori  6) History of colon polyps 7) Family history of colon cancer (paternal grandfather) -Colonoscopy completed at age 54 reportedly notable for polyps.  Completed outside facility without known size, location, number, histology.  Colonoscopy 2021 otherwise without polyps -Try to obtain prior colonoscopy records.  If no history of adenomatous polyps, would be reasonable to move to 10-year screening interval.  If prior history of adenoma, per guidelines, repeat at 5 years could be appropriate  RTC as needed  I spent 22 minutes of time, including independent review of results as outlined above, communicating results with the patient directly, face-to-face time with the patient, coordinating care, ordering studies and medications as appropriate, and documentation.         2022 ,DO, FACG 11/23/2019, 1:29 PM

## 2019-11-26 ENCOUNTER — Other Ambulatory Visit: Payer: Self-pay | Admitting: Sports Medicine

## 2019-11-26 ENCOUNTER — Other Ambulatory Visit: Payer: Self-pay | Admitting: Gastroenterology

## 2019-11-26 DIAGNOSIS — K2970 Gastritis, unspecified, without bleeding: Secondary | ICD-10-CM

## 2019-11-26 DIAGNOSIS — R12 Heartburn: Secondary | ICD-10-CM

## 2019-11-26 DIAGNOSIS — J302 Other seasonal allergic rhinitis: Secondary | ICD-10-CM

## 2019-12-20 ENCOUNTER — Other Ambulatory Visit: Payer: Self-pay

## 2019-12-20 DIAGNOSIS — R1013 Epigastric pain: Secondary | ICD-10-CM

## 2019-12-20 MED ORDER — PANTOPRAZOLE SODIUM 40 MG PO TBEC: 40.0000 mg | DELAYED_RELEASE_TABLET | Freq: Every day | ORAL | 1 refills | Status: DC

## 2020-01-07 ENCOUNTER — Encounter: Payer: Self-pay | Admitting: Nurse Practitioner

## 2020-01-07 ENCOUNTER — Other Ambulatory Visit: Payer: Self-pay | Admitting: Neurology

## 2020-01-07 ENCOUNTER — Ambulatory Visit (INDEPENDENT_AMBULATORY_CARE_PROVIDER_SITE_OTHER): Payer: BC Managed Care – PPO

## 2020-01-07 ENCOUNTER — Ambulatory Visit: Payer: BC Managed Care – PPO | Admitting: Nurse Practitioner

## 2020-01-07 ENCOUNTER — Other Ambulatory Visit: Payer: Self-pay

## 2020-01-07 VITALS — BP 113/74 | HR 60 | Ht 69.0 in | Wt 212.0 lb

## 2020-01-07 DIAGNOSIS — S6992XA Unspecified injury of left wrist, hand and finger(s), initial encounter: Secondary | ICD-10-CM

## 2020-01-07 DIAGNOSIS — S60222A Contusion of left hand, initial encounter: Secondary | ICD-10-CM | POA: Diagnosis not present

## 2020-01-07 NOTE — Progress Notes (Signed)
Acute Office Visit  Subjective:    Patient ID: Jimmy Garcia, male    DOB: 09-10-1970, 49 y.o.   MRN: [id number]  Chief Complaint  Patient presents with  . 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 He is able to flex and extend his fingers, flex and extend his wrist, and move his wrist laterally. There is pain noted with twisting motion of the hand and finger opposition movements.   He endorses edema, ecchymosis, and dorsal tenderness.  Denies palmar tenderness, severe pain, discoloration, or coldness to the hand.   Past Medical History:  Diagnosis Date  . Allergy   . Arthritis   . GERD (gastroesophageal reflux disease)     Past Surgical History:  Procedure Laterality Date  . ANTERIOR CRUCIATE LIGAMENT REPAIR Right    01/2015  . COLONOSCOPY     first one at age 40 but then age 30 possibly removed a polyp in [facility]. Last one done 2016    Family History  Problem Relation Age of Onset  . Colon cancer Paternal Grandfather   . Colon polyps Paternal Grandfather   . Esophageal cancer Neg Hx     Social History   Socioeconomic History  . Marital status: Married    Spouse name: Lydia Meng  . Number of children: Not on file  . Years of education: Not on file  . Highest education level: Not on file  Occupational History  . Occupation: Education  Tobacco Use  . Smoking status: Never Smoker  . Smokeless tobacco: Never Used  Substance and Sexual Activity  . Alcohol use: Yes    Comment: occasional  . Drug use: No  . Sexual activity: Yes    Partners: Female  Other Topics Concern  . Not on file  Social History Narrative  . Not on file   Social Determinants of Health   Financial Resource Strain:   . Difficulty of Paying Living Expenses:   Food Insecurity:   . Worried About Programme researcher, broadcasting/film/video in the Last Year:   . Occupational psychologist in the Last Year:   Transportation Needs:   . Freight forwarder (Medical):   Marland Kitchen Lack of Transportation (Non-Medical):   Physical Activity:   . Days of Exercise per Week:   . Minutes of Exercise per Session:   Stress:   . Feeling of Stress :   Social Connections:   . Frequency of Communication with Friends and Family:   . Frequency of Social Gatherings with Friends and Family:   . Attends Religious Services:   . Active Member of Clubs or Organizations:   . Attends Banker Meetings:   Marland Kitchen Marital Status:   Intimate Partner Violence:   . Fear of Current or Ex-Partner:   . Emotionally Abused:   Marland Kitchen Physically Abused:   . Sexually Abused:     Outpatient Medications Prior to Visit  Medication Sig Dispense Refill  . albuterol (PROVENTIL HFA;VENTOLIN HFA) 108 (90 Base) MCG/ACT inhaler Inhale 1-2 puffs into the lungs every 6 (six) hours as needed for wheezing or shortness of breath. 8.5 Inhaler 0  . docusate sodium (COLACE) 100 MG capsule Take 200 mg by mouth daily.    Marland Kitchen ipratropium (ATROVENT) 0.06 % nasal spray PLACE 1 SPRAY INTO BOTH NOSTRILS 4 (FOUR) TIMES DAILY AS NEEDED. 15  mL 3  . levocetirizine (XYZAL) 5 MG tablet TAKE 1 TABLET BY MOUTH EVERY DAY IN THE EVENING 90 tablet 3  . meloxicam (MOBIC) 15 MG tablet Take 1 tablet (15 mg total) by mouth daily. With a meal 90 tablet 1  . montelukast (SINGULAIR) 10 MG tablet TAKE 1 TABLET BY MOUTH EVERYDAY AT BEDTIME 90 tablet 3  . naloxegol oxalate (MOVANTIK) 25 MG TABS tablet Take 25 mg by mouth daily.    . sertraline (ZOLOFT) 100 MG tablet TAKE 1 TABLET BY MOUTH EVERY DAY 90 tablet 2  . topiramate (TOPAMAX) 100 MG tablet Take 1 tablet (100 mg total) by mouth 2 (two) times daily. 180 tablet 3  . XTAMPZA ER 18 MG C12A TAKE ONE CAPSULE EVERY 12 HOURS  0  . pantoprazole (PROTONIX) 40 MG tablet Take 1 tablet (40 mg total) by mouth 2 (two) times daily. 90 tablet 0  . pantoprazole (PROTONIX) 40 MG tablet Take 1 tablet (40 mg  total) by mouth daily. 90 tablet 1   No facility-administered medications prior to visit.    Allergies  Allergen Reactions  . Sulfa Antibiotics     Review of Systems  Constitutional: Negative for activity change, chills and fever.  Musculoskeletal: Positive for joint swelling.  Skin: Positive for color change.  Neurological: Negative for weakness and numbness.  Hematological: Does not bruise/bleed easily.  Psychiatric/Behavioral: Negative for sleep disturbance.       Objective:    Physical Exam Vitals and nursing note reviewed.  Constitutional:      Appearance: Normal appearance.  HENT:     Head: Normocephalic.  Eyes:     Extraocular Movements: Extraocular movements intact.     Conjunctiva/sclera: Conjunctivae normal.     Pupils: Pupils are equal, round, and reactive to light.  Cardiovascular:     Rate and Rhythm: Normal rate.     Pulses: Normal pulses.  Pulmonary:     Effort: Pulmonary effort is normal.  Musculoskeletal:        General: Swelling, tenderness and signs of injury present.     Right hand: Normal.     Left hand: Swelling and tenderness present. No bony tenderness. Decreased range of motion. Normal strength. Normal sensation. There is no disruption of two-point discrimination. Normal capillary refill. Normal pulse.       Arms:  Skin:    General: Skin is warm and dry.     Capillary Refill: Capillary refill takes less than 2 seconds.     Findings: Bruising present.  Neurological:     General: No focal deficit present.     Mental Status: He is alert and oriented to person, place, and time.  Psychiatric:        Mood and Affect: Mood normal.        Behavior: Behavior normal.        Thought Content: Thought content normal.        Judgment: Judgment normal.     BP 113/74   Pulse 60   Ht 5\' 9"  (1.753 m)   Wt 212 lb (96.2 kg)   SpO2 97%   BMI 31.31 kg/m  Wt Readings from Last 3 Encounters:  01/07/20 212 lb (96.2 kg)  11/23/19 220 lb (99.8 kg)    10/19/19 216 lb (98 kg)    Health Maintenance Due  Topic Date Due  . COVID-19 Vaccine (1) Never done    There are no preventive care reminders to display for this patient.   Lab Results  Component Value Date   TSH 0.73 05/03/2019   Lab Results  Component Value Date   WBC 4.6 05/03/2019   HGB 14.4 05/03/2019   HCT 42.4 05/03/2019   MCV 92.6 05/03/2019   PLT 186 05/03/2019   Lab Results  Component Value Date   NA 142 05/03/2019   K 3.7 05/03/2019   CO2 25 05/03/2019   GLUCOSE 81 05/03/2019   BUN 15 05/03/2019   CREATININE 0.98 05/03/2019   BILITOT 0.6 05/03/2019   ALKPHOS 58 04/01/2017   AST 20 05/03/2019   ALT 17 05/03/2019   PROT 6.0 (L) 05/03/2019   ALBUMIN 4.4 04/01/2017   CALCIUM 9.2 05/03/2019   Lab Results  Component Value Date   CHOL 167 05/03/2019   Lab Results  Component Value Date   HDL 55 05/03/2019   Lab Results  Component Value Date   LDLCALC 95 05/03/2019   Lab Results  Component Value Date   TRIG 78 05/03/2019   Lab Results  Component Value Date   CHOLHDL 3.0 05/03/2019   Lab Results  Component Value Date   HGBA1C 4.9 05/03/2019       Assessment & Plan:   1. Contusion of left hand, initial encounter Symptoms and presentation consistent with contusion of the left hand related to impact from baseball.  X-rays performed today are unremarkable for fracture.  There is soft tissue edema and ecchymosis present.  At this time conservative measures will include ice, rest, elevation, and NSAIDs for pain and edema. Discussed the option wrapping the area for stabilization however joint decision was made to avoid this at this time considering that the wrist and fingers are not involved in the injury. Encouraged the patient to contact the office if symptoms persist or fail to improve.   Orma Render, NP

## 2020-01-07 NOTE — Addendum Note (Signed)
Addended bySilvio Pate on: 01/07/2020 08:34 AM   Modules accepted: Orders

## 2020-01-07 NOTE — Patient Instructions (Signed)
Ice for 20 minutes at a time, several times a day.  Avoid hitting on anything or doing movement that may cause pain.  You may take ibuprofen (NSAIDs) or Tylenol to help with the pain.    Hand Contusion A hand contusion is a deep bruise to the hand. Contusions are the result of a blunt injury to tissues and muscle fibers under the skin. The injury causes bleeding under the skin. The skin overlying the contusion may turn blue, purple, or yellow. Minor injuries may cause a painless contusion, but more severe injuries may cause contusions that stay painful and swollen for a few weeks. What are the causes? This condition is usually caused by a hard hit or direct force to your hand, such as having a heavy object fall on your hand. What are the signs or symptoms? Symptoms of this condition include:  A swollen hand.  Pain and tenderness in your hand.  Discoloration of your hand. The area may have redness and then turn blue, purple, or yellow. How is this diagnosed? This condition is diagnosed based on:  A physical exam.  Your medical history.  Imaging studies, such as: ? An X-ray. This may be needed to check for other injuries, such as broken bones (fractures). ? A CT scan or an MRI. This may be done if your health care provider thinks you have torn or injured ligaments. How is this treated? This condition may be treated with:  Rest, ice, pressure (compression), and raising (elevating) the injured area. This is often called RICE therapy.  An elastic wrap to support your hand.  Over-the-counter medicines to control pain. Follow these instructions at home: RICE therapy   Rest the injured area.  If directed, put ice on the injured area. ? Put ice in a plastic bag. ? Place a towel between your skin and the bag. ? Leave the ice on for 20 minutes, 2-3 times a day.  If directed, apply light compression to the injured area using an elastic wrap. ? Make sure the wrap is not too  tight. ? If your fingers become numb or turn cold or blue, take the wrap off and reapply it more loosely. ? Remove and reapply the wrap as told by your health care provider.  Raise (elevate) the injured area above the level of your heart while you are sitting or lying down. General instructions  Take over-the-counter and prescription medicines only as told by your health care provider.  Protect your hand from getting injured further.  Keep all follow-up visits as told by your health care provider. This is important. Contact a health care provider if:  Your symptoms do not improve after several days of treatment.  You have increased redness, swelling, or pain in your hand or fingers.  You have difficulty moving the injured area.  Your swelling or pain is not relieved with medicines. Get help right away if:  You have severe pain.  Your hand or fingers become numb.  Your hand or fingers turn pale, blue, or cold.  You cannot move your hand or wrist.  Your hand is warm to the touch. Summary  A hand contusion is a deep bruise to the hand.  Contusions are the result of a blunt injury to tissues and muscle fibers under the skin.  This injury is treated with rest, ice, compression and elevation. This information is not intended to replace advice given to you by your health care provider. Make sure you discuss any questions you  have with your health care provider. Document Revised: 06/17/2018 Document Reviewed: 06/17/2018 Elsevier Patient Education  2020 [employer].

## 2020-03-08 ENCOUNTER — Other Ambulatory Visit: Payer: Self-pay | Admitting: Sports Medicine

## 2020-03-08 DIAGNOSIS — E669 Obesity, unspecified: Secondary | ICD-10-CM

## 2020-04-10 ENCOUNTER — Other Ambulatory Visit: Payer: Self-pay | Admitting: Sports Medicine

## 2020-04-10 DIAGNOSIS — G8929 Other chronic pain: Secondary | ICD-10-CM

## 2020-04-19 ENCOUNTER — Ambulatory Visit (INDEPENDENT_AMBULATORY_CARE_PROVIDER_SITE_OTHER): Payer: BC Managed Care – PPO

## 2020-04-19 ENCOUNTER — Other Ambulatory Visit: Payer: Self-pay

## 2020-04-19 ENCOUNTER — Encounter: Payer: Self-pay | Admitting: Sports Medicine

## 2020-04-19 ENCOUNTER — Ambulatory Visit (INDEPENDENT_AMBULATORY_CARE_PROVIDER_SITE_OTHER): Payer: BC Managed Care – PPO | Admitting: Sports Medicine

## 2020-04-19 DIAGNOSIS — M1731 Unilateral post-traumatic osteoarthritis, right knee: Secondary | ICD-10-CM

## 2020-04-19 DIAGNOSIS — E669 Obesity, unspecified: Secondary | ICD-10-CM | POA: Diagnosis not present

## 2020-04-19 MED ORDER — WEGOVY 1 MG/0.5ML ~~LOC~~ SOAJ: 1.0000 mg | SUBCUTANEOUS | 0 refills | Status: DC

## 2020-04-19 MED ORDER — WEGOVY 0.25 MG/0.5ML ~~LOC~~ SOAJ: 0.2500 mg | SUBCUTANEOUS | 0 refills | Status: DC

## 2020-04-19 MED ORDER — WEGOVY 0.5 MG/0.5ML ~~LOC~~ SOAJ: 0.5000 mg | SUBCUTANEOUS | 0 refills | Status: DC

## 2020-04-19 NOTE — Assessment & Plan Note (Signed)
Starting HLKTGY.

## 2020-04-19 NOTE — Progress Notes (Signed)
    Procedures performed today:    Procedure: Real-time Ultrasound Guided injection of the right knee Device: Samsung HS60  Verbal informed consent obtained.  Time-out conducted.  Noted no overlying erythema, induration, or other signs of local infection.  Skin prepped in a sterile fashion.  Local anesthesia: Topical Ethyl chloride.  With sterile technique and under real time ultrasound guidance: 1 cc Kenalog 40, 2 cc lidocaine, 2 cc bupivacaine injected easily Completed without difficulty  Pain immediately resolved suggesting accurate placement of the medication.  Advised to call if fevers/chills, erythema, induration, drainage, or persistent bleeding.  Images permanently stored and available for review in the ultrasound unit.  Impression: Technically successful ultrasound guided injection.  Independent interpretation of notes and tests performed by another provider:   None.  Brief History, Exam, Impression, and Recommendations:    Post-traumatic osteoarthritis of right knee Pleasant 49 year old male, he is post ACL reconstruction, graft rupture and repeat repair. He also had a partial meniscectomy, this was all approximately 5 years ago with Dr. Corinna Capra. Unfortunately having worsening of pain with gelling, lateral joint line, also pain with terminal flexion, no mechanical symptoms. I do think this is the expected posttraumatic osteoarthritis as is seen with ACL tears regardless of reconstruction. Adding x-rays, he is already taking meloxicam so we will inject his knee today, if he is no better in a month we will get an MRI to evaluate meniscal tearing and his second ACL graft. If simple osteoarthritis is seen then he would also be a candidate for viscosupplementation.  Obesity (BMI 30-39.9) Starting ATFTDD.    ___________________________________________ Ihor Austin. Benjamin Stain, M.D., ABFM., CAQSM. Primary Care and Sports Medicine [facility]  Adjunct  Instructor of Family Medicine  [employer]

## 2020-04-19 NOTE — Assessment & Plan Note (Signed)
Pleasant 49 year old male, he is post ACL reconstruction, graft rupture and repeat repair. He also had a partial meniscectomy, this was all approximately 5 years ago with Dr. Corinna Capra. Unfortunately having worsening of pain with gelling, lateral joint line, also pain with terminal flexion, no mechanical symptoms. I do think this is the expected posttraumatic osteoarthritis as is seen with ACL tears regardless of reconstruction. Adding x-rays, he is already taking meloxicam so we will inject his knee today, if he is no better in a month we will get an MRI to evaluate meniscal tearing and his second ACL graft. If simple osteoarthritis is seen then he would also be a candidate for viscosupplementation.

## 2020-04-20 ENCOUNTER — Ambulatory Visit (INDEPENDENT_AMBULATORY_CARE_PROVIDER_SITE_OTHER): Payer: BC Managed Care – PPO

## 2020-04-20 DIAGNOSIS — M1731 Unilateral post-traumatic osteoarthritis, right knee: Secondary | ICD-10-CM

## 2020-04-20 NOTE — Addendum Note (Signed)
Addended by: Annita Brod on: 04/20/2020 02:05 PM   Modules accepted: Orders

## 2020-04-29 ENCOUNTER — Other Ambulatory Visit: Payer: Self-pay | Admitting: Sports Medicine

## 2020-04-29 DIAGNOSIS — J302 Other seasonal allergic rhinitis: Secondary | ICD-10-CM

## 2020-05-11 ENCOUNTER — Other Ambulatory Visit: Payer: Self-pay | Admitting: Sports Medicine

## 2020-05-11 DIAGNOSIS — F524 Premature ejaculation: Secondary | ICD-10-CM

## 2020-05-16 ENCOUNTER — Ambulatory Visit (INDEPENDENT_AMBULATORY_CARE_PROVIDER_SITE_OTHER): Payer: BC Managed Care – PPO | Admitting: Sports Medicine

## 2020-05-16 ENCOUNTER — Other Ambulatory Visit: Payer: Self-pay

## 2020-05-16 DIAGNOSIS — M1731 Unilateral post-traumatic osteoarthritis, right knee: Secondary | ICD-10-CM | POA: Diagnosis not present

## 2020-05-16 DIAGNOSIS — E669 Obesity, unspecified: Secondary | ICD-10-CM

## 2020-05-16 NOTE — Assessment & Plan Note (Signed)
Wegovy is on back order, we will just have to wait for this.

## 2020-05-16 NOTE — Progress Notes (Signed)
    Procedures performed today:    None.  Independent interpretation of notes and tests performed by another provider:   None.  Brief History, Exam, Impression, and Recommendations:    Jimmy Garcia is a pleasant 49yo male who presents today for follow up of knee pain due to post traumatic OA. His pain is well managed at this point with meloxicam and previous injection a month ago. We suggested continuing at home exercises and wearing a knee compression sleeve during exercise. He can follow up with as needed.   Jimmy Garcia, MS3   ___________________________________________ Ihor Austin. Benjamin Stain, M.D., ABFM., CAQSM. Primary Care and Sports Medicine [place]  Adjunct Instructor of Family Medicine  [employer]

## 2020-05-16 NOTE — Assessment & Plan Note (Signed)
Jimmy Garcia is a pleasant 49 year old male post ACL reconstruction, graft rupture, and repeat repair. He also had a partial meniscectomy 5 years ago. He was having symptoms consistent with osteoarthritis we injected his knee the last visit, he is doing well today. He was given information on Orthovisc as he did have a bit of discomfort, return as needed for this.

## 2020-05-17 ENCOUNTER — Ambulatory Visit: Payer: BC Managed Care – PPO | Admitting: Sports Medicine

## 2020-06-28 ENCOUNTER — Telehealth (INDEPENDENT_AMBULATORY_CARE_PROVIDER_SITE_OTHER): Payer: BC Managed Care – PPO | Admitting: Sports Medicine

## 2020-06-28 DIAGNOSIS — R051 Acute cough: Secondary | ICD-10-CM | POA: Diagnosis not present

## 2020-06-28 DIAGNOSIS — R69 Illness, unspecified: Secondary | ICD-10-CM | POA: Insufficient documentation

## 2020-06-28 DIAGNOSIS — R6889 Other general symptoms and signs: Secondary | ICD-10-CM | POA: Insufficient documentation

## 2020-06-28 MED ORDER — FLUTICASONE PROPIONATE 50 MCG/ACT NA SUSP: NASAL | 3 refills | Status: DC

## 2020-06-28 MED ORDER — AMOXICILLIN-POT CLAVULANATE 875-125 MG PO TABS: 1.0000 | ORAL_TABLET | Freq: Two times a day (BID) | ORAL | 0 refills | Status: AC

## 2020-06-28 NOTE — Progress Notes (Signed)
   Virtual Visit via WebEx/MyChart   I connected with  Rodena Piety  on 06/28/20 via WebEx/MyChart/Doximity Video and verified that I am speaking with the correct person using two identifiers.   I discussed the limitations, risks, security and privacy concerns of performing an evaluation and management service by WebEx/MyChart/Doximity Video, including the higher likelihood of inaccurate diagnosis and treatment, and the availability of in person appointments.  We also discussed the likely need of an additional face to face encounter for complete and high quality delivery of care.  I also discussed with the patient that there may be a patient responsible charge related to this service. The patient expressed understanding and wishes to proceed.  Provider location is in medical facility. Patient location is at their home, different from provider location. People involved in care of the patient during this telehealth encounter were myself, my nurse/medical assistant, and my front office/scheduling team member.  Review of Systems: No fevers, chills, night sweats, weight loss, chest pain, or shortness of breath.   Objective Findings:    General: Speaking full sentences, no audible heavy breathing.  Sounds alert and appropriately interactive.  Appears well.  Face symmetric.  Extraocular movements intact.  Pupils equal and round.  No nasal flaring or accessory muscle use visualized.  Independent interpretation of tests performed by another provider:   None.  Brief History, Exam, Impression, and Recommendations:    Feeling sick This is a pleasant 49 year old male, for the past several days has had increasing pain and pressure over his maxillary sinuses, nonproductive cough, low-grade temperatures to 99 F, no anosmia or ageusia, no chest pain, no shortness of breath, speaking full sentences on the video visit. No obvious Covid exposures. Clinically this sounds like an acute maxillary sinusitis so  we will add Augmentin, Flonase. I have encouraged him to get a home Covid test, and I am going to take him out of work at least until Monday but certainly he would need a longer period of quarantine should the Covid test come back positive.   I discussed the above assessment and treatment plan with the patient. The patient was provided an opportunity to ask questions and all were answered. The patient agreed with the plan and demonstrated an understanding of the instructions.   The patient was advised to call back or seek an in-person evaluation if the symptoms worsen or if the condition fails to improve as anticipated.   I provided 30 minutes of face to face and non-face-to-face time during this encounter date, time was needed to gather information, review chart, records, communicate/coordinate with staff remotely, as well as complete documentation.   ___________________________________________ Ihor Austin. Benjamin Stain, M.D., ABFM., CAQSM. Primary Care and Sports Medicine [place]  Adjunct Instructor of Family Medicine  [employer]

## 2020-06-28 NOTE — Progress Notes (Signed)
Patient reports symptoms started Monday. Non-productive Cough, nasal drainage (clear). Alkaseltzer plus, Delsym, Mucinex-DM tried without relief.

## 2020-06-28 NOTE — Assessment & Plan Note (Signed)
This is a pleasant 49 year old male, for the past several days has had increasing pain and pressure over his maxillary sinuses, nonproductive cough, low-grade temperatures to 99 F, no anosmia or ageusia, no chest pain, no shortness of breath, speaking full sentences on the video visit. No obvious Covid exposures. Clinically this sounds like an acute maxillary sinusitis so we will add Augmentin, Flonase. I have encouraged him to get a home Covid test, and I am going to take him out of work at least until Monday but certainly he would need a longer period of quarantine should the Covid test come back positive.

## 2020-06-29 ENCOUNTER — Telehealth: Payer: Self-pay

## 2020-06-29 DIAGNOSIS — B349 Viral infection, unspecified: Secondary | ICD-10-CM

## 2020-06-29 MED ORDER — HYDROCOD POLST-CPM POLST ER 10-8 MG/5ML PO SUER: 5.0000 mL | Freq: Two times a day (BID) | ORAL | 0 refills | Status: DC | PRN

## 2020-06-29 NOTE — Telephone Encounter (Signed)
Sent in some Tussionex.

## 2020-06-29 NOTE — Telephone Encounter (Signed)
Patient calls to report that his first COVID test was negative.  Also, he thought there was supposed to be some cough medicine called in for him.  The pharmacy didn't get this.  [facility] [address] [place].

## 2020-08-15 ENCOUNTER — Other Ambulatory Visit: Payer: Self-pay | Admitting: Sports Medicine

## 2020-08-15 DIAGNOSIS — M25511 Pain in right shoulder: Secondary | ICD-10-CM

## 2020-09-07 ENCOUNTER — Other Ambulatory Visit: Payer: Self-pay | Admitting: Sports Medicine

## 2020-09-12 ENCOUNTER — Telehealth: Payer: Self-pay | Admitting: Neurology

## 2020-09-12 NOTE — Telephone Encounter (Signed)
Prior Authorization for Wegovy submitted via covermymeds. Awaiting response.  

## 2020-09-15 NOTE — Telephone Encounter (Addendum)
Prior authorization approved for Wegovy rx from 09/12/20 through 04/12/21. [facility] updated. PA# [place] health plan [phone] non-grandfathered [id number] TT.

## 2020-10-17 ENCOUNTER — Other Ambulatory Visit: Payer: Self-pay | Admitting: Sports Medicine

## 2020-10-17 DIAGNOSIS — E669 Obesity, unspecified: Secondary | ICD-10-CM

## 2020-10-17 MED ORDER — WEGOVY 1 MG/0.5ML ~~LOC~~ SOAJ: 1.0000 mg | SUBCUTANEOUS | 11 refills | Status: DC

## 2020-11-03 ENCOUNTER — Ambulatory Visit: Payer: BC Managed Care – PPO | Admitting: Sports Medicine

## 2020-11-29 ENCOUNTER — Other Ambulatory Visit: Payer: Self-pay | Admitting: Sports Medicine

## 2020-11-29 DIAGNOSIS — J302 Other seasonal allergic rhinitis: Secondary | ICD-10-CM

## 2020-12-19 ENCOUNTER — Encounter: Payer: Self-pay | Admitting: Sports Medicine

## 2020-12-19 ENCOUNTER — Ambulatory Visit (INDEPENDENT_AMBULATORY_CARE_PROVIDER_SITE_OTHER): Payer: BC Managed Care – PPO | Admitting: Sports Medicine

## 2020-12-19 ENCOUNTER — Other Ambulatory Visit: Payer: Self-pay

## 2020-12-19 VITALS — BP 100/66 | HR 60 | Ht 69.0 in | Wt 199.0 lb

## 2020-12-19 DIAGNOSIS — Z9622 Myringotomy tube(s) status: Secondary | ICD-10-CM

## 2020-12-19 DIAGNOSIS — Z0000 Encounter for general adult medical examination without abnormal findings: Secondary | ICD-10-CM | POA: Diagnosis not present

## 2020-12-19 DIAGNOSIS — E669 Obesity, unspecified: Secondary | ICD-10-CM

## 2020-12-19 MED ORDER — SAXENDA 18 MG/3ML ~~LOC~~ SOPN: 3.0000 mg | PEN_INJECTOR | Freq: Every day | SUBCUTANEOUS | 11 refills | Status: DC

## 2020-12-19 NOTE — Assessment & Plan Note (Signed)
Continues with Saxenda, no change in plan, doing well.

## 2020-12-19 NOTE — Assessment & Plan Note (Signed)
Annual physical as above, checking routine labs. 

## 2020-12-19 NOTE — Assessment & Plan Note (Signed)
Historically had bilateral tympanostomy tubes placed in 2018, the left one has fallen out, the right one is sitting in his canal, this was retrieved today.

## 2020-12-19 NOTE — Progress Notes (Signed)
Subjective:    CC: Annual Physical Exam  HPI:  This patient is here for their annual physical  I reviewed the past medical history, family history, social history, surgical history, and allergies today and no changes were needed.  Please see the problem list section below in epic for further details.  Past Medical History: Past Medical History:  Diagnosis Date  . Allergy   . Arthritis   . GERD (gastroesophageal reflux disease)    Past Surgical History: Past Surgical History:  Procedure Laterality Date  . ANTERIOR CRUCIATE LIGAMENT REPAIR Right    01/2015  . COLONOSCOPY     first one at age 62 but then age 6 possibly removed a polyp in [facility]. Last one done 2016   Social History: Social History   Socioeconomic History  . Marital status: Married    Spouse name: Saeed Toren  . Number of children: Not on file  . Years of education: Not on file  . Highest education level: Not on file  Occupational History  . Occupation: Education  Tobacco Use  . Smoking status: Never Smoker  . Smokeless tobacco: Never Used  Vaping Use  . Vaping Use: Never used  Substance and Sexual Activity  . Alcohol use: Yes    Comment: occasional  . Drug use: No  . Sexual activity: Yes    Partners: Female  Other Topics Concern  . Not on file  Social History Narrative  . Not on file   Social Determinants of Health   Financial Resource Strain: Not on file  Food Insecurity: Not on file  Transportation Needs: Not on file  Physical Activity: Not on file  Stress: Not on file  Social Connections: Not on file   Family History: Family History  Problem Relation Age of Onset  . Colon cancer Paternal Grandfather   . Colon polyps Paternal Grandfather   . Esophageal cancer Neg Hx    Allergies: Allergies  Allergen Reactions  . Sulfa Antibiotics    Medications: See med rec.  Review of Systems: No headache, visual changes, nausea, vomiting, diarrhea,  constipation, dizziness, abdominal pain, skin rash, fevers, chills, night sweats, swollen lymph nodes, weight loss, chest pain, body aches, joint swelling, muscle aches, shortness of breath, mood changes, visual or auditory hallucinations.  Objective:    General: Well Developed, well nourished, and in no acute distress.  Neuro: Alert and oriented x3, extra-ocular muscles intact, sensation grossly intact. Cranial nerves II through XII are intact, motor, sensory, and coordinative functions are all intact. HEENT: Normocephalic, atraumatic, pupils equal round reactive to light, neck supple, no masses, no lymphadenopathy, thyroid nonpalpable. Oropharynx, nasopharynx, left ear canal unremarkable, myringotomy tube sitting in the right canal, removed with alligator forceps. Skin: Warm and dry, no rashes noted.  Cardiac: Regular rate and rhythm, no murmurs rubs or gallops.  Respiratory: Clear to auscultation bilaterally. Not using accessory muscles, speaking in full sentences.  Abdominal: Soft, nontender, nondistended, positive bowel sounds, no masses, no organomegaly.  Musculoskeletal: Shoulder, elbow, wrist, hip, knee, ankle stable, and with full range of motion.  Impression and Recommendations:    The patient was counselled, risk factors were discussed, anticipatory guidance given.  Annual physical exam Annual physical as above, checking routine labs.   History of tympanostomy tube placement Historically had bilateral tympanostomy tubes placed in 2018, the left one has fallen out, the right one is sitting in his canal, this was retrieved today.  Obesity (BMI 30-39.9) Continues with Saxenda, no change in  plan, doing well.   ___________________________________________ Ihor Austin. Benjamin Stain, M.D., ABFM., CAQSM. Primary Care and Sports Medicine [facility]  Adjunct Professor of Family Medicine  [employer]

## 2020-12-20 ENCOUNTER — Telehealth: Payer: Self-pay

## 2020-12-20 LAB — COMPREHENSIVE METABOLIC PANEL
AG Ratio: 2.6 (calc) — ABNORMAL HIGH (ref 1.0–2.5)
ALT: 14 U/L (ref 9–46)
AST: 16 U/L (ref 10–40)
Albumin: 4.5 g/dL (ref 3.6–5.1)
Alkaline phosphatase (APISO): 49 U/L (ref 36–130)
BUN: 12 mg/dL (ref 7–25)
CO2: 24 mmol/L (ref 20–32)
Calcium: 9.4 mg/dL (ref 8.6–10.3)
Chloride: 109 mmol/L (ref 98–110)
Creat: 0.91 mg/dL (ref 0.60–1.35)
Globulin: 1.7 g/dL (calc) — ABNORMAL LOW (ref 1.9–3.7)
Glucose, Bld: 78 mg/dL (ref 65–99)
Potassium: 4.1 mmol/L (ref 3.5–5.3)
Sodium: 142 mmol/L (ref 135–146)
Total Bilirubin: 0.4 mg/dL (ref 0.2–1.2)
Total Protein: 6.2 g/dL (ref 6.1–8.1)

## 2020-12-20 LAB — LIPID PANEL
Cholesterol: 168 mg/dL (ref <200)
HDL: 62 mg/dL (ref >=40)
LDL Cholesterol (Calc): 86 mg/dL (calc)
Non-HDL Cholesterol (Calc): 106 mg/dL (calc) (ref <130)
Total CHOL/HDL Ratio: 2.7 (calc) (ref <5.0)
Triglycerides: 106 mg/dL (ref <150)

## 2020-12-20 LAB — CBC
HCT: 45.6 % (ref 38.5–50.0)
Hemoglobin: 15.7 g/dL (ref 13.2–17.1)
MCH: 32.3 pg (ref 27.0–33.0)
MCHC: 34.4 g/dL (ref 32.0–36.0)
MCV: 93.8 fL (ref 80.0–100.0)
MPV: 11.2 fL (ref 7.5–12.5)
Platelets: 204 10*3/uL (ref 140–400)
RBC: 4.86 10*6/uL (ref 4.20–5.80)
RDW: 12.1 % (ref 11.0–15.0)
WBC: 4.9 10*3/uL (ref 3.8–10.8)

## 2020-12-20 LAB — HEMOGLOBIN A1C
Hgb A1c MFr Bld: 4.8 % of total Hgb (ref <5.7)
Mean Plasma Glucose: 91 mg/dL
eAG (mmol/L): 5 mmol/L

## 2020-12-20 LAB — TSH: TSH: 0.86 mIU/L (ref 0.40–4.50)

## 2020-12-20 NOTE — Telephone Encounter (Signed)
Notice of Approval received via fax. Approval faxed to pharmacy. Approval sent for scanning into patient's chart.   Valid from 12/20/20 thru 04/21/21

## 2020-12-20 NOTE — Telephone Encounter (Signed)
Received fax from covermymeds regarding needed PA for patient's Saxenda 18mg /22mL. PA completed and submitted; awaiting response.

## 2021-02-07 ENCOUNTER — Other Ambulatory Visit: Payer: Self-pay | Admitting: Sports Medicine

## 2021-02-07 DIAGNOSIS — F524 Premature ejaculation: Secondary | ICD-10-CM

## 2021-03-06 ENCOUNTER — Other Ambulatory Visit: Payer: Self-pay | Admitting: Sports Medicine

## 2021-03-06 DIAGNOSIS — E669 Obesity, unspecified: Secondary | ICD-10-CM

## 2021-03-09 ENCOUNTER — Other Ambulatory Visit: Payer: Self-pay | Admitting: Sports Medicine

## 2021-03-09 DIAGNOSIS — G8929 Other chronic pain: Secondary | ICD-10-CM

## 2021-04-25 ENCOUNTER — Other Ambulatory Visit: Payer: Self-pay | Admitting: Sports Medicine

## 2021-04-25 DIAGNOSIS — J302 Other seasonal allergic rhinitis: Secondary | ICD-10-CM

## 2021-04-27 ENCOUNTER — Other Ambulatory Visit: Payer: Self-pay | Admitting: Sports Medicine

## 2021-04-27 DIAGNOSIS — E669 Obesity, unspecified: Secondary | ICD-10-CM

## 2021-04-27 MED ORDER — SAXENDA 18 MG/3ML ~~LOC~~ SOPN: 3.0000 mg | PEN_INJECTOR | Freq: Every day | SUBCUTANEOUS | 11 refills | Status: DC

## 2021-05-25 ENCOUNTER — Telehealth: Payer: Self-pay

## 2021-05-25 NOTE — Telephone Encounter (Signed)
Medication: Liraglutide -Weight Management (SAXENDA) 18 MG/3ML SOPN  Prior authorization submitted via CoverMyMeds on 05/25/2021 PA submission pending  

## 2021-05-25 NOTE — Telephone Encounter (Signed)
This encounter was created in error - please disregard.

## 2021-05-29 NOTE — Telephone Encounter (Signed)
Medication: Liraglutide -Weight Management ([place]) 18 MG/3ML SOPN Prior authorization determination received Medication has been denied Reason for denial: "Your plan covers this drug when you have met these conditions: -You are at least 48 years of age or older -You have completed at least 16 weeks of therapy with the requested drug -You lost at least 4 percent of your body weight or -You have continued to keep your initial 4 percent weight loss off

## 2021-06-19 ENCOUNTER — Telehealth: Payer: Self-pay

## 2021-06-19 NOTE — Telephone Encounter (Signed)
Medication: Liraglutide -Weight Management (SAXENDA) 18 MG/3ML SOPN Prior authorization submitted via CoverMyMeds on 06/19/2021 PA submission pending

## 2021-06-20 NOTE — Telephone Encounter (Signed)
Medication: Liraglutide -Weight Management (SAXENDA) 18 MG/3ML SOPN Prior authorization determination received Medication has been denied Reason for denial:  "You do not meet the requirements of your plan. Your plan covers this drug when you have met these conditions: - You are at least 48 years of age or older - You have completed at least 16 weeks of therapy with the requested drug - You lost at least 4 percent of your body weight or - You have continued to keep your initial 4 percent weight loss off"  

## 2021-06-22 ENCOUNTER — Ambulatory Visit (INDEPENDENT_AMBULATORY_CARE_PROVIDER_SITE_OTHER): Payer: BC Managed Care – PPO

## 2021-06-22 ENCOUNTER — Ambulatory Visit (INDEPENDENT_AMBULATORY_CARE_PROVIDER_SITE_OTHER): Payer: BC Managed Care – PPO | Admitting: Sports Medicine

## 2021-06-22 DIAGNOSIS — M1731 Unilateral post-traumatic osteoarthritis, right knee: Secondary | ICD-10-CM | POA: Diagnosis not present

## 2021-06-22 DIAGNOSIS — E669 Obesity, unspecified: Secondary | ICD-10-CM

## 2021-06-22 MED ORDER — SAXENDA 18 MG/3ML ~~LOC~~ SOPN: 3.0000 mg | PEN_INJECTOR | Freq: Every day | SUBCUTANEOUS | 11 refills | Status: DC

## 2021-06-22 NOTE — Assessment & Plan Note (Signed)
Increasing pain, chronic process with exacerbation, Last injected August 2021, repeat injection today, return as needed. He is reffing basketball.

## 2021-06-22 NOTE — Progress Notes (Signed)
    Procedures performed today:    Procedure: Real-time Ultrasound Guided injection of the right knee Device: Samsung HS60  Verbal informed consent obtained.  Time-out conducted.  Noted no overlying erythema, induration, or other signs of local infection.  Skin prepped in a sterile fashion.  Local anesthesia: Topical Ethyl chloride.  With sterile technique and under real time ultrasound guidance: No effusion noted,  1cc Kenalog 40, 2 cc lidocaine, 2 cc bupivacaine injected easily Completed without difficulty  Advised to call if fevers/chills, erythema, induration, drainage, or persistent bleeding.  Images permanently stored and available for review in PACS.  Impression: Technically successful ultrasound guided injection.  Independent interpretation of notes and tests performed by another provider:   None.  Brief History, Exam, Impression, and Recommendations:    Post-traumatic osteoarthritis of right knee Increasing pain, chronic process with exacerbation, Last injected August 2021, repeat injection today, return as needed. He is reffing basketball.    ___________________________________________ Ihor Austin. Benjamin Stain, M.D., ABFM., CAQSM. Primary Care and Sports Medicine [facility]  Adjunct Instructor of Family Medicine  [employer]

## 2021-07-10 ENCOUNTER — Ambulatory Visit: Payer: BC Managed Care – PPO | Admitting: Sports Medicine

## 2021-07-10 ENCOUNTER — Other Ambulatory Visit: Payer: Self-pay

## 2021-07-10 DIAGNOSIS — J40 Bronchitis, not specified as acute or chronic: Secondary | ICD-10-CM

## 2021-07-10 DIAGNOSIS — J329 Chronic sinusitis, unspecified: Secondary | ICD-10-CM | POA: Insufficient documentation

## 2021-07-10 MED ORDER — AZITHROMYCIN 250 MG PO TABS: ORAL_TABLET | ORAL | 0 refills | Status: DC

## 2021-07-10 MED ORDER — HYDROCOD POLST-CPM POLST ER 10-8 MG/5ML PO SUER: 5.0000 mL | Freq: Two times a day (BID) | ORAL | 0 refills | Status: DC | PRN

## 2021-07-10 MED ORDER — PREDNISONE 50 MG PO TABS: 50.0000 mg | ORAL_TABLET | Freq: Every day | ORAL | 0 refills | Status: DC

## 2021-07-10 NOTE — Progress Notes (Signed)
    Procedures performed today:    None.  Independent interpretation of notes and tests performed by another provider:   None.  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 Symptoms moderate, persistent. Negative COVID test, no nausea, vomiting, diarrhea, no skin rash. Chest is clear, nasal turbinates boggy. Adding azithromycin, prednisone, Tussionex. Return to see me if no better in a week or 2.    ___________________________________________ Ihor Austin. Benjamin Stain, M.D., ABFM., CAQSM. Primary Care and Sports Medicine [facility]  Adjunct Instructor of Family Medicine  [employer]

## 2021-07-10 NOTE — Assessment & Plan Note (Signed)
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 Symptoms moderate, persistent. Negative COVID test, no nausea, vomiting, diarrhea, no skin rash. Chest is clear, nasal turbinates boggy. Adding azithromycin, prednisone, Tussionex. Return to see me if no better in a week or 2.

## 2021-07-13 ENCOUNTER — Telehealth: Payer: Self-pay

## 2021-07-13 NOTE — Telephone Encounter (Signed)
Medication: Liraglutide -Weight Management (SAXENDA) 18 MG/3ML SOPN Prior authorization submitted via CoverMyMeds on 07/13/2021 PA submission pending  

## 2021-07-19 IMAGING — MR MR SHOULDER*L* W/O CM
5 series · 40 of 40 positions shown · non-contrast
Comparison: None.

CLINICAL DATA: Left shoulder pain and limited and painful range of
motion.

EXAM:
MRI OF THE LEFT SHOULDER WITHOUT CONTRAST
TECHNIQUE: Multiplanar, multisequence MR imaging of the shoulder was performed.
No intravenous contrast was administered.

[Series 3: PD fat-sat · axial · 4.0mm · 0.59mm/px · z∈[-54,+41]mm · 8 of 23 slices shown (1 of 2)]
[im 1/23]
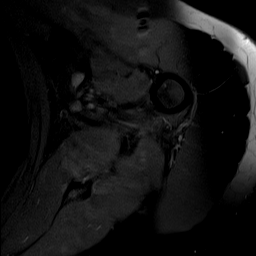
[im 4/23]
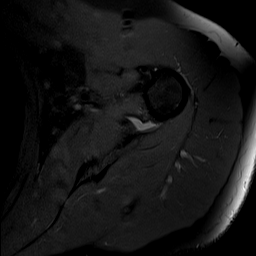
[im 7/23]
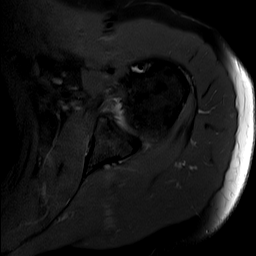
[im 10/23]
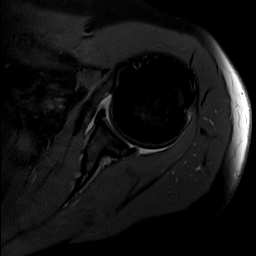
[im 13/23]
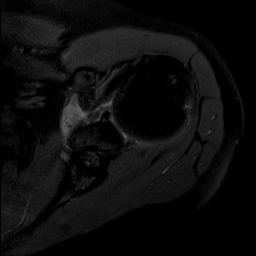
[im 16/23]
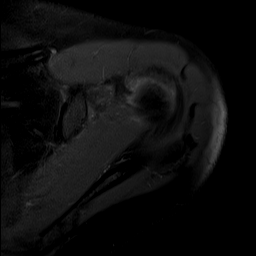
[im 19/23]
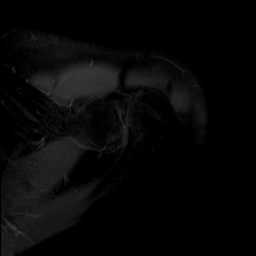
[im 23/23]
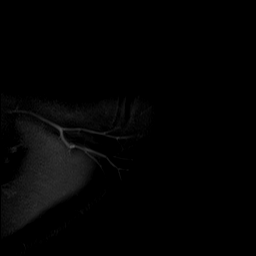

[Series 4: T2 fat-sat · oblique · 4.0mm · 0.59mm/px · 7 of 19 slices shown (1 of 2)]
[im 1/19]
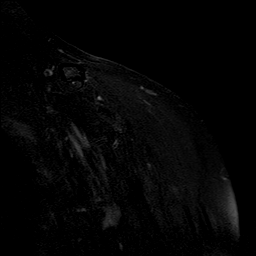
[im 4/19]
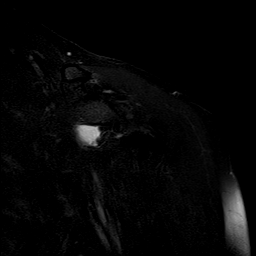
[im 7/19]
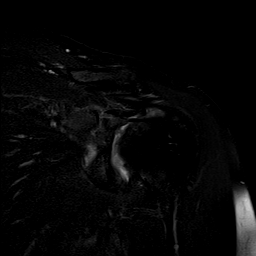
[im 10/19]
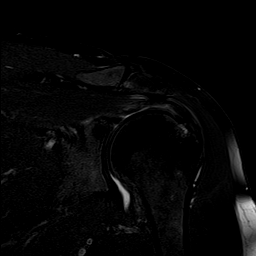
[im 13/19]
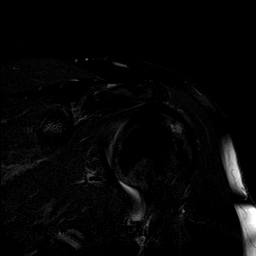
[im 16/19]
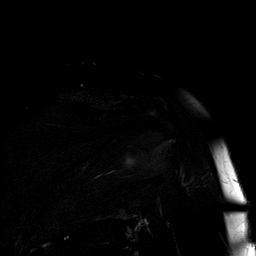
[im 19/19]
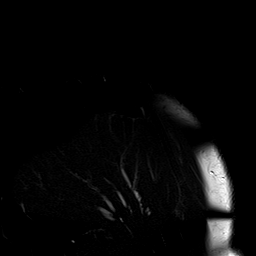

[Series 5: PD fat-sat · oblique · 4.0mm · 0.59mm/px · 7 of 19 slices shown (2 of 2)]
[im 1/19]
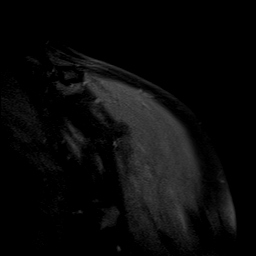
[im 4/19]
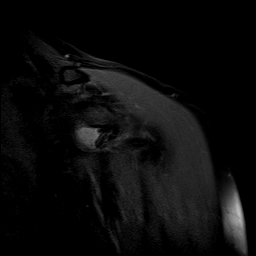
[im 7/19]
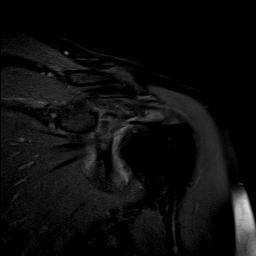
[im 10/19]
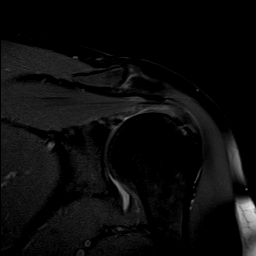
[im 13/19]
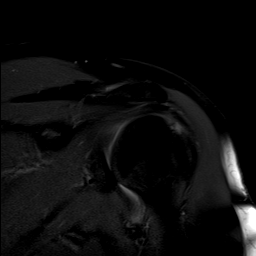
[im 16/19]
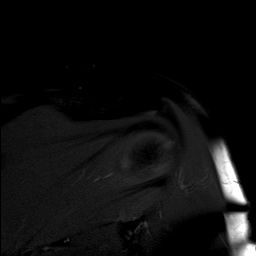
[im 19/19]
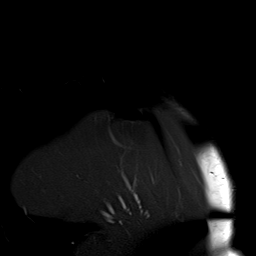

[Series 6: T1 · oblique · 4.0mm · 0.59mm/px · 9 of 25 slices shown]
[im 1/25]
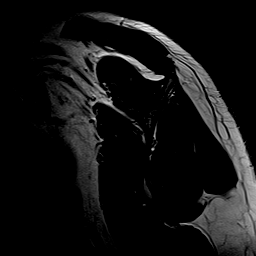
[im 4/25]
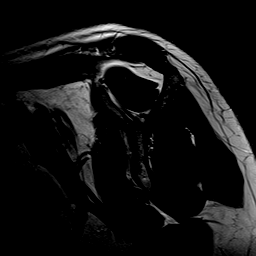
[im 7/25]
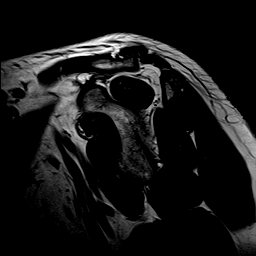
[im 10/25]
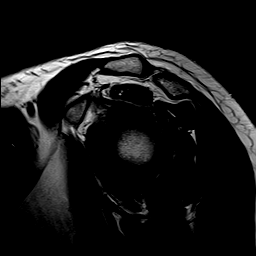
[im 13/25]
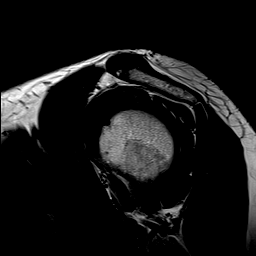
[im 16/25]
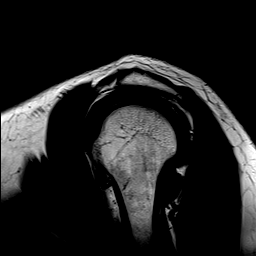
[im 19/25]
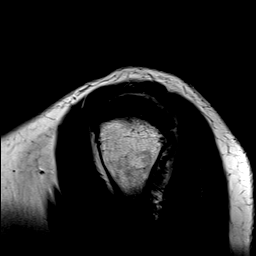
[im 22/25]
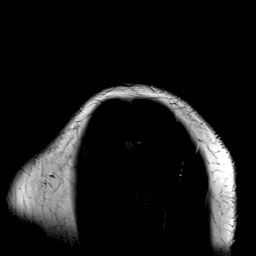
[im 25/25]
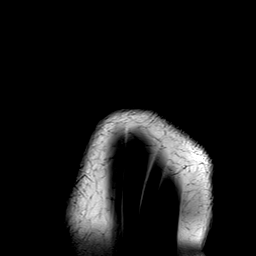

[Series 7: T2 fat-sat · oblique · 4.0mm · 0.59mm/px · 9 of 25 slices shown (2 of 2)]
[im 1/25]
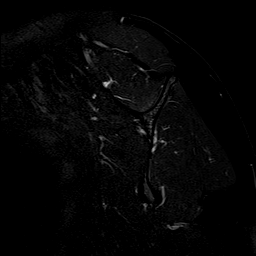
[im 4/25]
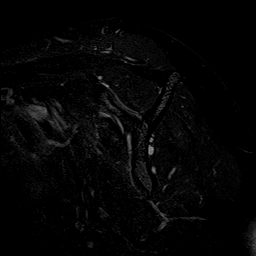
[im 7/25]
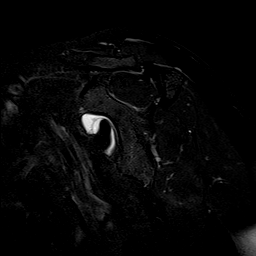
[im 10/25]
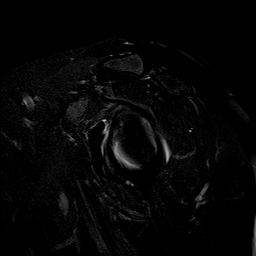
[im 13/25]
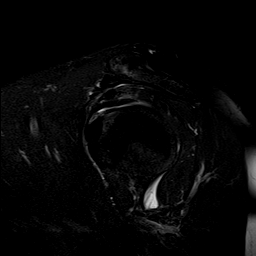
[im 16/25]
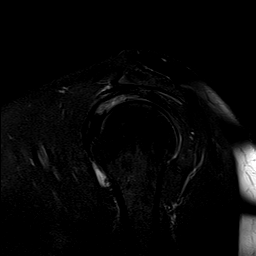
[im 19/25]
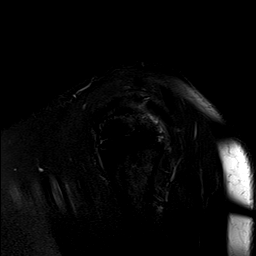
[im 22/25]
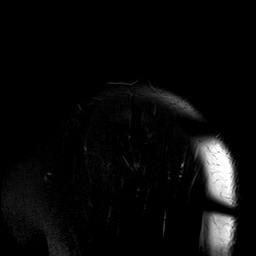
[im 25/25]
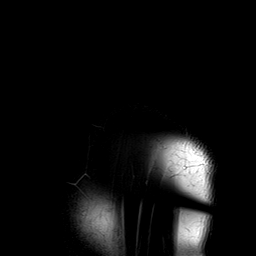

[40 of 40 positions shown; findings below may reference images not displayed]

FINDINGS: Rotator cuff: The rotator cuff is intact. However, there is abnormal
signal from the supraspinatus tendon consistent with
tendinitis/tendinopathy. The other tendons of the rotator cuff
appear normal.

Muscles: No atrophy or abnormal signal of the muscles of the rotator
cuff.

Biceps long head: Properly located and intact. The tendon is quite
diminutive.

Acromioclavicular Joint:  Normal.  Type 1 acromion. No bursitis.

Glenohumeral Joint: Small glenohumeral joint effusion. No chondral
defect.

Labrum:  Intact.

Bones:  Normal.

Other: None
IMPRESSION: 1. Degenerative changes of the supraspinatus tendon.
2. Small nonspecific glenohumeral joint effusion. This could
represent mild synovitis.

## 2021-07-23 ENCOUNTER — Encounter: Payer: Self-pay | Admitting: Sports Medicine

## 2021-07-31 NOTE — Telephone Encounter (Signed)
Medication: Liraglutide -Weight Management (SAXENDA) 18 MG/3ML SOPN Prior authorization determination received Medication has been denied Reason for denial:  "You do not meet the requirements of your plan. Your plan covers this drug when you have met these conditions: - You are at least 48 years of age or older - You have completed at least 16 weeks of therapy with the requested drug - You lost at least 4 percent of your body weight or - You have continued to keep your initial 4 percent weight loss off"  

## 2021-08-31 ENCOUNTER — Encounter: Payer: Self-pay | Admitting: Family Medicine

## 2021-08-31 ENCOUNTER — Ambulatory Visit: Payer: BC Managed Care – PPO | Admitting: Family Medicine

## 2021-08-31 ENCOUNTER — Other Ambulatory Visit: Payer: Self-pay

## 2021-08-31 VITALS — BP 120/73 | HR 71 | Wt 215.0 lb

## 2021-08-31 DIAGNOSIS — J0140 Acute pansinusitis, unspecified: Secondary | ICD-10-CM

## 2021-08-31 MED ORDER — AMOXICILLIN-POT CLAVULANATE 875-125 MG PO TABS: 1.0000 | ORAL_TABLET | Freq: Two times a day (BID) | ORAL | 0 refills | Status: AC

## 2021-08-31 NOTE — Progress Notes (Signed)
Acute Office Visit  Subjective:    Patient ID: Jimmy Garcia, male    DOB: [birth year], 50 y.o.   MRN: [id number]  Chief Complaint  Patient presents with   Cough    HPI Patient is in today for cough.   Patient reports about a week of cough and sinus symptoms. He is reporting 4-5/10 sinus pressure, headaches, nasal congestion, drainage, fatigue, nonproductive cough that is worse at night. He has not had any chest pain, shortness of breath, wheezing, nausea, vomiting, diarrhea. He has been getting minimal relief with dayquil, nyquil, flonase, afrin, tylenol.     Past Medical History:  Diagnosis Date   Allergy    Arthritis    GERD (gastroesophageal reflux disease)     Past Surgical History:  Procedure Laterality Date   ANTERIOR CRUCIATE LIGAMENT REPAIR Right    01/2015   COLONOSCOPY     first one at age 64 but then age 29 possibly removed a polyp in [facility]. Last one done 2016    Family History  Problem Relation Age of Onset   Colon cancer Paternal Grandfather    Colon polyps Paternal Grandfather    Esophageal cancer Neg Hx     Social History   Socioeconomic History   Marital status: Married    Spouse name: Eddie Payette   Number of children: Not on file   Years of education: Not on file   Highest education level: Not on file  Occupational History   Occupation: Education  Tobacco Use   Smoking status: Never   Smokeless tobacco: Never  Vaping Use   Vaping Use: Never used  Substance and Sexual Activity   Alcohol use: Yes    Comment: occasional   Drug use: No   Sexual activity: Yes    Partners: Female  Other Topics Concern   Not on file  Social History Narrative   Not on file   Social Determinants of Health   Financial Resource Strain: Not on file  Food Insecurity: Not on file  Transportation Needs: Not on file  Physical Activity: Not on file  Stress: Not on file  Social Connections: Not on file  Intimate  Partner Violence: Not on file    Outpatient Medications Prior to Visit  Medication Sig Dispense Refill   docusate sodium (COLACE) 100 MG capsule Take 200 mg by mouth daily.     fluticasone (FLONASE) 50 MCG/ACT nasal spray One spray in each nostril twice a day 48 g 3   levocetirizine (XYZAL) 5 MG tablet TAKE 1 TABLET BY MOUTH EVERY DAY IN THE EVENING 90 tablet 3   meloxicam (MOBIC) 15 MG tablet TAKE 1 TABLET EVERY DAY WITH A MEAL 90 tablet 1   montelukast (SINGULAIR) 10 MG tablet TAKE 1 TABLET BY MOUTH EVERYDAY AT BEDTIME 90 tablet 3   MOVANTIK 25 MG TABS tablet TAKE 1 TABLET BY MOUTH EVERY DAY 90 tablet 2   sertraline (ZOLOFT) 100 MG tablet TAKE 1 TABLET BY MOUTH EVERY DAY 90 tablet 2   topiramate (TOPAMAX) 100 MG tablet TAKE 1 TABLET BY MOUTH TWICE A DAY 180 tablet 3   XTAMPZA ER 18 MG C12A TAKE ONE CAPSULE EVERY 12 HOURS  0   albuterol (PROVENTIL HFA;VENTOLIN HFA) 108 (90 Base) MCG/ACT inhaler Inhale 1-2 puffs into the lungs every 6 (six) hours as needed for wheezing or shortness of breath. 8.5 Inhaler 0   azithromycin (ZITHROMAX Z-PAK) 250 MG tablet Take 2 tablets (500 mg) on  Day 1,  followed by 1 tablet (250 mg) once daily on Days 2 through 5. 6 tablet 0   chlorpheniramine-HYDROcodone (TUSSIONEX) 10-8 MG/5ML SUER Take 5 mLs by mouth every 12 (twelve) hours as needed for cough (cough, will cause drowsiness.). 120 mL 0   predniSONE (DELTASONE) 50 MG tablet Take 1 tablet (50 mg total) by mouth daily. 5 tablet 0   No facility-administered medications prior to visit.    Allergies  Allergen Reactions   Sulfa Antibiotics     Review of Systems All review of systems negative except what is listed in the HPI     Objective:    Physical Exam Vitals reviewed.  Constitutional:      Appearance: Normal appearance.  HENT:     Head: Normocephalic and atraumatic.     Right Ear: Tympanic membrane normal.     Left Ear: Tympanic membrane normal.     Nose: Congestion present.      Mouth/Throat:     Mouth: Mucous membranes are moist.     Pharynx: Oropharynx is clear.  Eyes:     Extraocular Movements: Extraocular movements intact.     Conjunctiva/sclera: Conjunctivae normal.  Cardiovascular:     Rate and Rhythm: Normal rate and regular rhythm.     Pulses: Normal pulses.     Heart sounds: Normal heart sounds.  Pulmonary:     Effort: Pulmonary effort is normal.     Breath sounds: Normal breath sounds. No wheezing, rhonchi or rales.  Musculoskeletal:     Cervical back: Normal range of motion and neck supple. No tenderness.  Lymphadenopathy:     Cervical: No cervical adenopathy.  Skin:    General: Skin is warm and dry.     Findings: No rash.  Neurological:     General: No focal deficit present.     Mental Status: He is alert and oriented to person, place, and time. Mental status is at baseline.  Psychiatric:        Mood and Affect: Mood normal.        Behavior: Behavior normal.        Thought Content: Thought content normal.        Judgment: Judgment normal.    BP 120/73    Pulse 71    Wt 215 lb (97.5 kg)    SpO2 96%    BMI 31.75 kg/m  Wt Readings from Last 3 Encounters:  08/31/21 215 lb (97.5 kg)  07/10/21 215 lb (97.5 kg)  12/19/20 199 lb (90.3 kg)    Health Maintenance Due  Topic Date Due   Hepatitis C Screening  Never done   COVID-19 Vaccine (4 - Booster for Moderna series) 09/22/2020    There are no preventive care reminders to display for this patient.   Lab Results  Component Value Date   TSH 0.86 12/19/2020   Lab Results  Component Value Date   WBC 4.9 12/19/2020   HGB 15.7 12/19/2020   HCT 45.6 12/19/2020   MCV 93.8 12/19/2020   PLT 204 12/19/2020   Lab Results  Component Value Date   NA 142 12/19/2020   K 4.1 12/19/2020   CO2 24 12/19/2020   GLUCOSE 78 12/19/2020   BUN 12 12/19/2020   CREATININE 0.91 12/19/2020   BILITOT 0.4 12/19/2020   ALKPHOS 58 04/01/2017   AST 16 12/19/2020   ALT 14 12/19/2020   PROT 6.2  12/19/2020   ALBUMIN 4.4 04/01/2017   CALCIUM 9.4 12/19/2020   Lab Results  Component Value Date   CHOL 168 12/19/2020   Lab Results  Component Value Date   HDL 62 12/19/2020   Lab Results  Component Value Date   LDLCALC 86 12/19/2020   Lab Results  Component Value Date   TRIG 106 12/19/2020   Lab Results  Component Value Date   CHOLHDL 2.7 12/19/2020   Lab Results  Component Value Date   HGBA1C 4.8 12/19/2020       Assessment & Plan:   1. Acute non-recurrent pansinusitis Augmentin sent in given duration. Just had prednisone <2 months ago so trying to hold for now. He will let us know if not improving after the weekend.  Continue supportive measures including rest, hydration, humidifier use, steam showers, warm compresses to sinuses, warm liquids with lemon and honey, and over-the-counter cough, cold, and analgesics as needed.     - amoxicillin-clavulanate (AUGMENTIN) 875-125 MG tablet; Take 1 tablet by mouth 2 (two) times daily for 10 days.  Dispense: 20 tablet; Refill: 0   Follow-up if symptoms worsen or fail to improve.   Clayborne Dana, NP

## 2021-08-31 NOTE — Patient Instructions (Signed)
Antibiotics sent in  Continue supportive measures including rest, hydration, humidifier use, steam showers, warm compresses to sinuses, warm liquids with lemon and honey, and over-the-counter cough, cold, and analgesics as needed.    Please contact office for follow-up if symptoms do not improve or worsen. Seek emergency care if symptoms become severe.

## 2021-08-31 NOTE — Progress Notes (Signed)
Pt reports that he began experiencing some sinus issues 6 days ago. Sinus pain/pressure/drainage,cough, and fatigue. He has had some bloody mucus   He has been using a nasal rinse,dayquil, nyquil, afrin, and his other prescribed medications. Took Covid test yesterday this was negative.

## 2021-09-05 ENCOUNTER — Other Ambulatory Visit: Payer: Self-pay | Admitting: Sports Medicine

## 2021-09-05 DIAGNOSIS — M25511 Pain in right shoulder: Secondary | ICD-10-CM

## 2021-09-05 DIAGNOSIS — G8929 Other chronic pain: Secondary | ICD-10-CM

## 2021-09-07 ENCOUNTER — Telehealth: Payer: Self-pay

## 2021-09-07 NOTE — Telephone Encounter (Signed)
Medication: Liraglutide -Weight Management (SAXENDA) 18 MG/3ML SOPN Prior authorization submitted via CoverMyMeds on 09/07/2021 PA submission pending

## 2021-09-08 NOTE — Telephone Encounter (Signed)
Medication:  Liraglutide -Weight Management ([place]) 18 MG/3ML SOPN Prior authorization determination received Medication has been denied Reason for denial:  "You do not meet the requirements of your plan. Current plan approved criteria covers this drug when you have completed at least 16 weeks of therapy and met these conditions: - You are at least 49 years of age or older - You lost at least 4 percent of your body weight or - You have continued to keep your initial 4 percent weight loss off"

## 2021-11-07 ENCOUNTER — Other Ambulatory Visit: Payer: Self-pay | Admitting: Sports Medicine

## 2021-11-07 DIAGNOSIS — F524 Premature ejaculation: Secondary | ICD-10-CM

## 2021-11-09 ENCOUNTER — Other Ambulatory Visit: Payer: Self-pay

## 2021-11-09 ENCOUNTER — Ambulatory Visit: Payer: BC Managed Care – PPO | Admitting: Sports Medicine

## 2021-11-09 DIAGNOSIS — E669 Obesity, unspecified: Secondary | ICD-10-CM | POA: Diagnosis not present

## 2021-11-09 DIAGNOSIS — S0181XA Laceration without foreign body of other part of head, initial encounter: Secondary | ICD-10-CM | POA: Insufficient documentation

## 2021-11-09 DIAGNOSIS — R6889 Other general symptoms and signs: Secondary | ICD-10-CM

## 2021-11-09 MED ORDER — FLUTICASONE PROPIONATE 50 MCG/ACT NA SUSP: NASAL | 3 refills | Status: DC

## 2021-11-09 MED ORDER — TOPIRAMATE 100 MG PO TABS: 100.0000 mg | ORAL_TABLET | Freq: Two times a day (BID) | ORAL | 3 refills | Status: DC

## 2021-11-09 NOTE — Assessment & Plan Note (Signed)
Jimmy Garcia had a superficial laceration while working on a tree 6 days ago, he was seen in urgent care, 3 sutures placed, he is here today for further evaluation. ?Incision is clean, dry, intact, 3 sutures removed, applied some Dermabond to brace the wound. ?Return to see me as needed, he is up-to-date on tetanus, he was vaccinated at the time of the laceration repair. ?

## 2021-11-09 NOTE — Progress Notes (Signed)
? ? ?  Procedures performed today:   ? ?None. ? ?Independent interpretation of notes and tests performed by another provider:  ? ?None. ? ?Brief History, Exam, Impression, and Recommendations:   ? ?Laceration of skin of forehead ?Leny had a superficial laceration while working on a tree 6 days ago, he was seen in urgent care, 3 sutures placed, he is here today for further evaluation. ?Incision is clean, dry, intact, 3 sutures removed, applied some Dermabond to brace the wound. ?Return to see me as needed, he is up-to-date on tetanus, he was vaccinated at the time of the laceration repair. ? ? ? ?___________________________________________ ?Ihor Austin. Benjamin Stain, M.D., ABFM., CAQSM. ?Primary Care and Sports Medicine ?[facility] ? ?Adjunct Instructor of Family Medicine  ?[employer] ?

## 2021-12-04 ENCOUNTER — Other Ambulatory Visit: Payer: Self-pay | Admitting: Sports Medicine

## 2021-12-04 DIAGNOSIS — J302 Other seasonal allergic rhinitis: Secondary | ICD-10-CM

## 2021-12-20 ENCOUNTER — Telehealth: Payer: Self-pay | Admitting: Sports Medicine

## 2021-12-20 ENCOUNTER — Ambulatory Visit (INDEPENDENT_AMBULATORY_CARE_PROVIDER_SITE_OTHER): Payer: BC Managed Care – PPO | Admitting: Sports Medicine

## 2021-12-20 ENCOUNTER — Ambulatory Visit (INDEPENDENT_AMBULATORY_CARE_PROVIDER_SITE_OTHER): Payer: BC Managed Care – PPO

## 2021-12-20 VITALS — BP 128/77 | HR 65 | Ht 69.0 in | Wt 218.0 lb

## 2021-12-20 DIAGNOSIS — M25561 Pain in right knee: Secondary | ICD-10-CM | POA: Diagnosis not present

## 2021-12-20 DIAGNOSIS — M1731 Unilateral post-traumatic osteoarthritis, right knee: Secondary | ICD-10-CM | POA: Diagnosis not present

## 2021-12-20 DIAGNOSIS — E669 Obesity, unspecified: Secondary | ICD-10-CM | POA: Diagnosis not present

## 2021-12-20 DIAGNOSIS — M25562 Pain in left knee: Secondary | ICD-10-CM

## 2021-12-20 DIAGNOSIS — Z0000 Encounter for general adult medical examination without abnormal findings: Secondary | ICD-10-CM

## 2021-12-20 MED ORDER — DICLOFENAC SODIUM 75 MG PO TBEC: 75.0000 mg | DELAYED_RELEASE_TABLET | Freq: Two times a day (BID) | ORAL | 3 refills | Status: DC

## 2021-12-20 NOTE — Progress Notes (Signed)
?Subjective:   ? ?CC: Annual Physical Exam ? ?HPI:  ?This patient is here for their annual physical ? ?I reviewed the past medical history, family history, social history, surgical history, and allergies today and no changes were needed.  Please see the problem list section below in epic for further details. ? ?Past Medical History: ?Past Medical History:  ?Diagnosis Date  ? Allergy   ? Arthritis   ? GERD (gastroesophageal reflux disease)   ? ?Past Surgical History: ?Past Surgical History:  ?Procedure Laterality Date  ? ANTERIOR CRUCIATE LIGAMENT REPAIR Right   ? 01/2015  ? COLONOSCOPY    ? first one at age 51 but then age 57 possibly removed a polyp in [facility]. Last one done 2016  ? ?Social History: ?Social History  ? ?Socioeconomic History  ? Marital status: Married  ?  Spouse name: Shahram Alexopoulos  ? Number of children: Not on file  ? Years of education: Not on file  ? Highest education level: Not on file  ?Occupational History  ? Occupation: Education  ?Tobacco Use  ? Smoking status: Never  ? Smokeless tobacco: Never  ?Vaping Use  ? Vaping Use: Never used  ?Substance and Sexual Activity  ? Alcohol use: Yes  ?  Comment: occasional  ? Drug use: No  ? Sexual activity: Yes  ?  Partners: Female  ?Other Topics Concern  ? Not on file  ?Social History Narrative  ? Not on file  ? ?Social Determinants of Health  ? ?Financial Resource Strain: Not on file  ?Food Insecurity: Not on file  ?Transportation Needs: Not on file  ?Physical Activity: Not on file  ?Stress: Not on file  ?Social Connections: Not on file  ? ?Family History: ?Family History  ?Problem Relation Age of Onset  ? Colon cancer Paternal Grandfather   ? Colon polyps Paternal Grandfather   ? Esophageal cancer Neg Hx   ? ?Allergies: ?Allergies  ?Allergen Reactions  ? Sulfa Antibiotics   ? ?Medications: See med rec. ? ?Review of Systems: No headache, visual changes, nausea, vomiting, diarrhea, constipation, dizziness, abdominal  pain, skin rash, fevers, chills, night sweats, swollen lymph nodes, weight loss, chest pain, body aches, joint swelling, muscle aches, shortness of breath, mood changes, visual or auditory hallucinations. ? ?Objective:   ? ?General: Well Developed, well nourished, and in no acute distress.  ?Neuro: Alert and oriented x3, extra-ocular muscles intact, sensation grossly intact. Cranial nerves II through XII are intact, motor, sensory, and coordinative functions are all intact. ?HEENT: Normocephalic, atraumatic, pupils equal round reactive to light, neck supple, no masses, no lymphadenopathy, thyroid nonpalpable. Oropharynx, nasopharynx, external ear canals are unremarkable. ?Skin: Warm and dry, no rashes noted.  ?Cardiac: Regular rate and rhythm, no murmurs rubs or gallops.  ?Respiratory: Clear to auscultation bilaterally. Not using accessory muscles, speaking in full sentences.  ?Abdominal: Soft, nontender, nondistended, positive bowel sounds, no masses, no organomegaly.  ?Musculoskeletal: Shoulder, elbow, wrist, hip, knee, ankle stable, and with full range of motion. ? ?Impression and Recommendations:   ? ?The patient was counselled, risk factors were discussed, anticipatory guidance given. ? ?Annual physical exam ?Annual physical as above, updated on screenings, checking routine labs. ? ?Post-traumatic osteoarthritis of right knee ?Jimmy Garcia has posttraumatic right knee osteoarthritis, he has had a couple of injections, first in August 2021, we did another 1 in October 2022, he really did not get significant relief, we will get some updated x-rays, I am also going to get him approved for  viscosupplementation. ?Switching him to Voltaren. ?He has failed greater than 6 weeks of physician directed conservative treatment, NSAIDs, activity modification and has x-ray confirmed osteoarthritis. ?Return to start gel shots when approved. ? ? ?___________________________________________ ?Ihor Austin. Benjamin Stain, M.D., ABFM.,  CAQSM. ?Primary Care and Sports Medicine ?[facility] ? ?Adjunct Professor of Family Medicine  ?[employer] ?

## 2021-12-20 NOTE — Telephone Encounter (Signed)
[employer] paperwork faxed to [employer] at [phone] °Request is for Orthovisc or Monovisc °Pt's insurance prefers Orthovisc °Fax confirmation receipt received  °

## 2021-12-20 NOTE — Assessment & Plan Note (Signed)
Annual physical as above, updated on screenings, checking routine labs. ?

## 2021-12-20 NOTE — Telephone Encounter (Signed)
Viscosupplementation approval please, right knee only, x-ray confirmed osteoarthritis, failed greater than 6 weeks of physician directed physical therapy, NSAIDs, activity modification, steroid injections. ?

## 2021-12-20 NOTE — Assessment & Plan Note (Signed)
Teague has posttraumatic right knee osteoarthritis, he has had a couple of injections, first in August 2021, we did another 1 in October 2022, he really did not get significant relief, we will get some updated x-rays, I am also going to get him approved for viscosupplementation. ?Switching him to Voltaren. ?He has failed greater than 6 weeks of physician directed conservative treatment, NSAIDs, activity modification and has x-ray confirmed osteoarthritis. ?Return to start gel shots when approved. ?

## 2021-12-21 LAB — LIPID PANEL
Cholesterol: 162 mg/dL (ref <200)
HDL: 65 mg/dL (ref >=40)
LDL Cholesterol (Calc): 82 mg/dL (calc)
Non-HDL Cholesterol (Calc): 97 mg/dL (calc) (ref <130)
Total CHOL/HDL Ratio: 2.5 (calc) (ref <5.0)
Triglycerides: 70 mg/dL (ref <150)

## 2021-12-21 LAB — HEMOGLOBIN A1C
Hgb A1c MFr Bld: 5.1 % of total Hgb (ref <5.7)
Mean Plasma Glucose: 100 mg/dL
eAG (mmol/L): 5.5 mmol/L

## 2021-12-21 LAB — COMPREHENSIVE METABOLIC PANEL
AG Ratio: 2.2 (calc) (ref 1.0–2.5)
ALT: 17 U/L (ref 9–46)
AST: 17 U/L (ref 10–40)
Albumin: 4.4 g/dL (ref 3.6–5.1)
Alkaline phosphatase (APISO): 53 U/L (ref 36–130)
BUN: 14 mg/dL (ref 7–25)
CO2: 24 mmol/L (ref 20–32)
Calcium: 9.4 mg/dL (ref 8.6–10.3)
Chloride: 109 mmol/L (ref 98–110)
Creat: 0.87 mg/dL (ref 0.60–1.29)
Globulin: 2 g/dL (calc) (ref 1.9–3.7)
Glucose, Bld: 81 mg/dL (ref 65–99)
Potassium: 3.9 mmol/L (ref 3.5–5.3)
Sodium: 141 mmol/L (ref 135–146)
Total Bilirubin: 0.8 mg/dL (ref 0.2–1.2)
Total Protein: 6.4 g/dL (ref 6.1–8.1)

## 2021-12-21 LAB — CBC
HCT: 47.6 % (ref 38.5–50.0)
Hemoglobin: 16.3 g/dL (ref 13.2–17.1)
MCH: 31.7 pg (ref 27.0–33.0)
MCHC: 34.2 g/dL (ref 32.0–36.0)
MCV: 92.6 fL (ref 80.0–100.0)
MPV: 11.6 fL (ref 7.5–12.5)
Platelets: 203 10*3/uL (ref 140–400)
RBC: 5.14 10*6/uL (ref 4.20–5.80)
RDW: 11.9 % (ref 11.0–15.0)
WBC: 5.7 10*3/uL (ref 3.8–10.8)

## 2021-12-21 LAB — TSH: TSH: 1.11 mIU/L (ref 0.40–4.50)

## 2021-12-26 NOTE — Telephone Encounter (Signed)
Benefits Investigation Details received from [employer] ?Injection: Orthovisc ? ?Medical: Deductible does not apply. Once the OOP has been met, patient is covered at 100%.Only one copay applies per date of service. Prior authorization is required through Utilization Mgmt. To initiate, call [phone]. ? ?PA required: Yes ?PA submitted on covermymeds.  ? ?Pharmacy: This product is not covered under the pharmacy plan.  ? ?Specialty Pharmacy: [facility] ? ?May fill through: [facility] [place] ?OV Copay/Coinsurance:  ?Product Copay: $80 ?Administration Coinsurance:  ?Administration Copay:  ?Deductible:  ?Out of Pocket Max: [phone] (met: $716.79)   ?

## 2021-12-27 NOTE — Telephone Encounter (Addendum)
PA determination received from covermymeds ?PA has been approved for Orthrovisc from 12/26/21-10/22/23Orthovisc ?Pt received a phone called and left a  voicemail of approval awaiting to hear back from pt . ? Pt was approved  ?Pt responsibility is $80 per injection. ? Spoke to pt in regards to injection  he is currently be scheduled for his first injection. ? ? ? ?

## 2021-12-29 ENCOUNTER — Other Ambulatory Visit: Payer: Self-pay | Admitting: Sports Medicine

## 2021-12-29 DIAGNOSIS — J302 Other seasonal allergic rhinitis: Secondary | ICD-10-CM

## 2022-01-04 ENCOUNTER — Ambulatory Visit (INDEPENDENT_AMBULATORY_CARE_PROVIDER_SITE_OTHER): Payer: BC Managed Care – PPO

## 2022-01-04 ENCOUNTER — Ambulatory Visit: Payer: BC Managed Care – PPO | Admitting: Sports Medicine

## 2022-01-04 DIAGNOSIS — M1731 Unilateral post-traumatic osteoarthritis, right knee: Secondary | ICD-10-CM | POA: Diagnosis not present

## 2022-01-04 NOTE — Assessment & Plan Note (Signed)
Orthovisc No. 1 of 4 right knee, return in 1 week for #2 of 4. 

## 2022-01-04 NOTE — Progress Notes (Signed)
? ? ?  Procedures performed today:   ? ?Procedure: Real-time Ultrasound Guided injection of the right knee ?Device: Samsung HS60  ?Verbal informed consent obtained.  ?Time-out conducted.  ?Noted no overlying erythema, induration, or other signs of local infection.  ?Skin prepped in a sterile fashion.  ?Local anesthesia: Topical Ethyl chloride.  ?With sterile technique and under real time ultrasound guidance: Noted trace effusion, 30 mg/2 mL of OrthoVisc (sodium hyaluronate) in a prefilled syringe was injected easily into the knee through a 22-gauge needle. ?Completed without difficulty  ?Advised to call if fevers/chills, erythema, induration, drainage, or persistent bleeding.  ?Images permanently stored and available for review in PACS.  ?Impression: Technically successful ultrasound guided injection. ? ?Independent interpretation of notes and tests performed by another provider:  ? ?None. ? ?Brief History, Exam, Impression, and Recommendations:   ? ?Post-traumatic osteoarthritis of right knee ?Orthovisc No. 1 of 4 right knee, return in 1 week for #2 of 4. ? ? ? ?___________________________________________ ?Ihor Austin. Benjamin Stain, M.D., ABFM., CAQSM. ?Primary Care and Sports Medicine ?[facility] ? ?Adjunct Instructor of Family Medicine  ?[employer] ?

## 2022-01-11 ENCOUNTER — Ambulatory Visit (INDEPENDENT_AMBULATORY_CARE_PROVIDER_SITE_OTHER): Payer: BC Managed Care – PPO

## 2022-01-11 ENCOUNTER — Ambulatory Visit (INDEPENDENT_AMBULATORY_CARE_PROVIDER_SITE_OTHER): Payer: BC Managed Care – PPO | Admitting: Sports Medicine

## 2022-01-11 DIAGNOSIS — M1731 Unilateral post-traumatic osteoarthritis, right knee: Secondary | ICD-10-CM

## 2022-01-11 NOTE — Assessment & Plan Note (Signed)
Orthovisc 2 of 4, return in 1 week for #3 of 4. ?

## 2022-01-11 NOTE — Progress Notes (Signed)
? ? ?  Procedures performed today:   ? ?Procedure: Real-time Ultrasound Guided injection of the right knee ?Device: Samsung HS60  ?Verbal informed consent obtained.  ?Time-out conducted.  ?Noted no overlying erythema, induration, or other signs of local infection.  ?Skin prepped in a sterile fashion.  ?Local anesthesia: Topical Ethyl chloride.  ?With sterile technique and under real time ultrasound guidance: Noted trace effusion, 30 mg/2 mL of OrthoVisc (sodium hyaluronate) in a prefilled syringe was injected easily into the knee through a 22-gauge needle. ?Completed without difficulty  ?Advised to call if fevers/chills, erythema, induration, drainage, or persistent bleeding.  ?Images permanently stored and available for review in PACS.  ?Impression: Technically successful ultrasound guided injection. ? ?Independent interpretation of notes and tests performed by another provider:  ? ?None. ? ?Brief History, Exam, Impression, and Recommendations:   ? ?Post-traumatic osteoarthritis of right knee ?Orthovisc 2 of 4, return in 1 week for #3 of 4. ? ? ? ?___________________________________________ ?Ihor Austin. Benjamin Stain, M.D., ABFM., CAQSM. ?Primary Care and Sports Medicine ?[facility] ? ?Adjunct Instructor of Family Medicine  ?[employer] ?

## 2022-01-18 ENCOUNTER — Ambulatory Visit (INDEPENDENT_AMBULATORY_CARE_PROVIDER_SITE_OTHER): Payer: BC Managed Care – PPO

## 2022-01-18 ENCOUNTER — Ambulatory Visit: Payer: BC Managed Care – PPO | Admitting: Sports Medicine

## 2022-01-18 DIAGNOSIS — M1731 Unilateral post-traumatic osteoarthritis, right knee: Secondary | ICD-10-CM

## 2022-01-18 NOTE — Progress Notes (Signed)
    Procedures performed today:    Procedure: Real-time Ultrasound Guided injection of the right knee Device: Samsung HS60  Verbal informed consent obtained.  Time-out conducted.  Noted no overlying erythema, induration, or other signs of local infection.  Skin prepped in a sterile fashion.  Local anesthesia: Topical Ethyl chloride.  With sterile technique and under real time ultrasound guidance: Noted trace effusion, 30 mg/2 mL of OrthoVisc (sodium hyaluronate) in a prefilled syringe was injected easily into the knee through a 22-gauge needle. Completed without difficulty  Advised to call if fevers/chills, erythema, induration, drainage, or persistent bleeding.  Images permanently stored and available for review in PACS.  Impression: Technically successful ultrasound guided injection.  Independent interpretation of notes and tests performed by another provider:   None.  Brief History, Exam, Impression, and Recommendations:    Post-traumatic osteoarthritis of right knee Orthovisc 3 of 4 right knee, return in 1 week for #4 of 4.    ___________________________________________ Ihor Austin. Benjamin Stain, M.D., ABFM., CAQSM. Primary Care and Sports Medicine [facility]  Adjunct Instructor of Family Medicine  [employer]

## 2022-01-18 NOTE — Assessment & Plan Note (Signed)
Orthovisc 3 of 4 right knee, return in 1 week for #4 of 4.  

## 2022-01-25 ENCOUNTER — Ambulatory Visit (INDEPENDENT_AMBULATORY_CARE_PROVIDER_SITE_OTHER): Payer: BC Managed Care – PPO | Admitting: Sports Medicine

## 2022-01-25 ENCOUNTER — Ambulatory Visit (INDEPENDENT_AMBULATORY_CARE_PROVIDER_SITE_OTHER): Payer: BC Managed Care – PPO

## 2022-01-25 DIAGNOSIS — M1731 Unilateral post-traumatic osteoarthritis, right knee: Secondary | ICD-10-CM

## 2022-01-25 NOTE — Progress Notes (Signed)
    Procedures performed today:    Procedure: Real-time Ultrasound Guided injection of the right knee Device: Samsung HS60  Verbal informed consent obtained.  Time-out conducted.  Noted no overlying erythema, induration, or other signs of local infection.  Skin prepped in a sterile fashion.  Local anesthesia: Topical Ethyl chloride.  With sterile technique and under real time ultrasound guidance: Noted trace effusion, 30 mg/2 mL of OrthoVisc (sodium hyaluronate) in a prefilled syringe was injected easily into the knee through a 22-gauge needle. Completed without difficulty  Advised to call if fevers/chills, erythema, induration, drainage, or persistent bleeding.  Images permanently stored and available for review in PACS.  Impression: Technically successful ultrasound guided injection.  Independent interpretation of notes and tests performed by another provider:   None.  Brief History, Exam, Impression, and Recommendations:    Post-traumatic osteoarthritis of right knee Orthovisc 4 of 4 right knee. The pain is doing really well, for the most part pain-free. Return to see me as needed.    ___________________________________________ Ihor Austin. Benjamin Stain, M.D., ABFM., CAQSM. Primary Care and Sports Medicine [facility]  Adjunct Instructor of Family Medicine  [employer]

## 2022-01-25 NOTE — Assessment & Plan Note (Addendum)
Orthovisc 4 of 4 right knee. The pain is doing really well, for the most part pain-free. Return to see me as needed.

## 2022-01-26 ENCOUNTER — Other Ambulatory Visit: Payer: Self-pay | Admitting: Sports Medicine

## 2022-01-26 DIAGNOSIS — J302 Other seasonal allergic rhinitis: Secondary | ICD-10-CM

## 2022-02-25 ENCOUNTER — Ambulatory Visit: Payer: BC Managed Care – PPO | Admitting: Sports Medicine

## 2022-02-27 ENCOUNTER — Ambulatory Visit: Payer: BC Managed Care – PPO | Admitting: Sports Medicine

## 2022-03-03 ENCOUNTER — Other Ambulatory Visit: Payer: Self-pay | Admitting: Sports Medicine

## 2022-03-03 DIAGNOSIS — G8929 Other chronic pain: Secondary | ICD-10-CM

## 2022-04-21 ENCOUNTER — Other Ambulatory Visit: Payer: Self-pay | Admitting: Sports Medicine

## 2022-04-21 DIAGNOSIS — J302 Other seasonal allergic rhinitis: Secondary | ICD-10-CM

## 2022-05-07 ENCOUNTER — Other Ambulatory Visit: Payer: Self-pay | Admitting: Sports Medicine

## 2022-05-07 DIAGNOSIS — F524 Premature ejaculation: Secondary | ICD-10-CM

## 2022-06-21 ENCOUNTER — Ambulatory Visit: Payer: BC Managed Care – PPO | Admitting: Family Medicine

## 2022-06-21 ENCOUNTER — Encounter: Payer: Self-pay | Admitting: Family Medicine

## 2022-06-21 VITALS — BP 111/70 | HR 82 | Ht 69.0 in | Wt 223.0 lb

## 2022-06-21 DIAGNOSIS — J40 Bronchitis, not specified as acute or chronic: Secondary | ICD-10-CM | POA: Diagnosis not present

## 2022-06-21 DIAGNOSIS — R6889 Other general symptoms and signs: Secondary | ICD-10-CM | POA: Diagnosis not present

## 2022-06-21 DIAGNOSIS — R058 Other specified cough: Secondary | ICD-10-CM | POA: Insufficient documentation

## 2022-06-21 DIAGNOSIS — J329 Chronic sinusitis, unspecified: Secondary | ICD-10-CM | POA: Diagnosis not present

## 2022-06-21 MED ORDER — AMOXICILLIN-POT CLAVULANATE 875-125 MG PO TABS: 1.0000 | ORAL_TABLET | Freq: Two times a day (BID) | ORAL | 0 refills | Status: DC

## 2022-06-21 MED ORDER — METHYLPREDNISOLONE 4 MG PO TBPK: ORAL_TABLET | ORAL | 0 refills | Status: DC

## 2022-06-21 MED ORDER — HYDROCOD POLI-CHLORPHE POLI ER 10-8 MG/5ML PO SUER: 5.0000 mL | Freq: Two times a day (BID) | ORAL | 0 refills | Status: DC | PRN

## 2022-06-21 NOTE — Patient Instructions (Signed)
Do not take xyzal while on the tussinex cough medicine as it can cause a bad reaction

## 2022-06-21 NOTE — Assessment & Plan Note (Addendum)
-   flu and covid tested and interpreted by myself as both negative - given tussinex for cough

## 2022-06-21 NOTE — Progress Notes (Signed)
Acute Office Visit  Subjective:     Patient ID: Jimmy Garcia, male    DOB: [birth year], 51 y.o.   MRN: [id number]  Chief Complaint  Patient presents with   Nasal Congestion    HPI Patient is in today for flu like symptoms. He said symptoms started about two weeks ago and seemed to be resolving last weekend. He self treated with vitamin c and otc remedies. However for the past 5 days he felt like he has gotten worse. He does have a history of ear infections and has bilateral tubes. Does not note any drainage from his ears. Admits to low grade fevers and body aches. Did say he was sent home from work yesterday due to illness. Admits to sinus pain and headaches.   Review of Systems  Constitutional:  Positive for chills and malaise/fatigue.  HENT:  Positive for congestion.   Respiratory:  Positive for cough. Negative for shortness of breath and wheezing.   Cardiovascular:  Negative for chest pain.        Objective:    BP 111/70   Pulse 82   Ht 5\' 9"  (1.753 m)   Wt 223 lb (101.2 kg)   SpO2 99%   BMI 32.93 kg/m    Physical Exam Vitals and nursing note reviewed.  Constitutional:      General: He is in acute distress.     Appearance: Normal appearance.  HENT:     Head: Normocephalic and atraumatic.     Comments: Tenderness to palpation of frontal sinus bilaterally    Right Ear: Tympanic membrane, ear canal and external ear normal.     Left Ear: Tympanic membrane, ear canal and external ear normal.     Ears:     Comments: Tube present in R ear. No tube appreciated in L ear.     Nose: Nose normal.     Mouth/Throat:     Pharynx: Posterior oropharyngeal erythema present.  Eyes:     Conjunctiva/sclera: Conjunctivae normal.  Cardiovascular:     Rate and Rhythm: Normal rate and regular rhythm.  Pulmonary:     Effort: Pulmonary effort is normal.     Breath sounds: Normal breath sounds.  Neurological:     General: No focal deficit present.     Mental Status: He is alert  and oriented to person, place, and time.  Psychiatric:        Mood and Affect: Mood normal.        Behavior: Behavior normal.        Thought Content: Thought content normal.        Judgment: Judgment normal.     No results found for any visits on 06/21/22.      Assessment & Plan:   Problem List Items Addressed This Visit       Respiratory   Sinobronchitis    - tenderness of sinuses along with duration of symptoms warrant abx therapy  - will go ahead and give augmentin - pt feeling fatigue and will give steroid dose pack to help with inflammatory symptoms - follow up in 2 weeks if no better with PCP       Relevant Medications   chlorpheniramine-HYDROcodone (TUSSIONEX) 10-8 MG/5ML   amoxicillin-clavulanate (AUGMENTIN) 875-125 MG tablet   methylPREDNISolone (MEDROL DOSEPAK) 4 MG TBPK tablet     Other   Flu-like symptoms - Primary    - flu and covid tested and interpreted by myself as both negative - given tussinex for cough  Meds ordered this encounter  Medications   chlorpheniramine-HYDROcodone (TUSSIONEX) 10-8 MG/5ML    Sig: Take 5 mLs by mouth every 12 (twelve) hours as needed for cough (cough, will cause drowsiness.).    Dispense:  120 mL    Refill:  0   amoxicillin-clavulanate (AUGMENTIN) 875-125 MG tablet    Sig: Take 1 tablet by mouth 2 (two) times daily.    Dispense:  20 tablet    Refill:  0   methylPREDNISolone (MEDROL DOSEPAK) 4 MG TBPK tablet    Sig: Follow instructions on package    Dispense:  21 tablet    Refill:  0    Return in about 2 weeks (around 07/05/2022), or if symptoms worsen or fail to improve.  Owens Loffler, DO

## 2022-06-21 NOTE — Assessment & Plan Note (Signed)
-   tenderness of sinuses along with duration of symptoms warrant abx therapy  - will go ahead and give augmentin - pt feeling fatigue and will give steroid dose pack to help with inflammatory symptoms - follow up in 2 weeks if no better with PCP

## 2022-07-08 ENCOUNTER — Encounter: Payer: Self-pay | Admitting: Sports Medicine

## 2022-07-08 ENCOUNTER — Ambulatory Visit: Payer: BC Managed Care – PPO | Admitting: Sports Medicine

## 2022-07-08 ENCOUNTER — Ambulatory Visit (INDEPENDENT_AMBULATORY_CARE_PROVIDER_SITE_OTHER): Payer: BC Managed Care – PPO

## 2022-07-08 VITALS — BP 117/81 | HR 57 | Ht 69.0 in | Wt 227.0 lb

## 2022-07-08 DIAGNOSIS — R058 Other specified cough: Secondary | ICD-10-CM

## 2022-07-08 MED ORDER — HYDROCODONE BIT-HOMATROP MBR 5-1.5 MG/5ML PO SOLN: 5.0000 mL | Freq: Three times a day (TID) | ORAL | 0 refills | Status: DC | PRN

## 2022-07-08 MED ORDER — BENZONATATE 200 MG PO CAPS: 200.0000 mg | ORAL_CAPSULE | Freq: Three times a day (TID) | ORAL | 0 refills | Status: DC | PRN

## 2022-07-08 NOTE — Progress Notes (Signed)
    Procedures performed today:    None.  Independent interpretation of notes and tests performed by another provider:   None.  Brief History, Exam, Impression, and Recommendations:    Post-viral cough syndrome Jimmy Garcia returns, he is about 3 weeks post diagnosis of sinobronchitis with antibiotics, unfortunately continues to have a dry cough. Keeps him up at night. COVID and flu testing were negative. He was given Tussionex which was not helping. Continue Mucinex DM, adding Tessalon Perles, Hycodan syrup. I did explain to him that postviral cough syndrome could last 6 weeks. We will get a chest x-ray. Return to see me as needed.    ____________________________________________ Jimmy Her. Dianah Field, M.D., ABFM., CAQSM., AME. Primary Care and Sports Medicine [facility]  Adjunct Professor of [place] [employer] [facility] [employer] of Medicine  Risk manager

## 2022-07-08 NOTE — Assessment & Plan Note (Signed)
Jimmy Garcia returns, he is about 3 weeks post diagnosis of sinobronchitis with antibiotics, unfortunately continues to have a dry cough. Keeps him up at night. COVID and flu testing were negative. He was given Tussionex which was not helping. Continue Mucinex DM, adding Tessalon Perles, Hycodan syrup. I did explain to him that postviral cough syndrome could last 6 weeks. We will get a chest x-ray. Return to see me as needed.

## 2022-07-09 ENCOUNTER — Encounter: Payer: Self-pay | Admitting: Sports Medicine

## 2022-07-09 ENCOUNTER — Other Ambulatory Visit: Payer: Self-pay

## 2022-07-09 DIAGNOSIS — R058 Other specified cough: Secondary | ICD-10-CM

## 2022-07-09 MED ORDER — HYDROCODONE BIT-HOMATROP MBR 5-1.5 MG/5ML PO SOLN: 5.0000 mL | Freq: Three times a day (TID) | ORAL | 0 refills | Status: DC | PRN

## 2022-07-09 NOTE — Telephone Encounter (Signed)
Sent!

## 2022-07-09 NOTE — Telephone Encounter (Signed)
Patient would like for you to send it to [facility] pharmacy in [place]

## 2022-07-15 ENCOUNTER — Other Ambulatory Visit: Payer: Self-pay | Admitting: Sports Medicine

## 2022-07-15 DIAGNOSIS — R058 Other specified cough: Secondary | ICD-10-CM

## 2022-07-22 MED ORDER — BENZONATATE 200 MG PO CAPS: 200.0000 mg | ORAL_CAPSULE | Freq: Three times a day (TID) | ORAL | 11 refills | Status: DC | PRN

## 2022-07-22 NOTE — Addendum Note (Signed)
Addended by: Monica Becton on: 07/22/2022 02:08 PM   Modules accepted: Orders

## 2022-07-22 NOTE — Addendum Note (Signed)
Addended by: Chalmers Cater on: 07/22/2022 01:24 PM   Modules accepted: Orders

## 2022-07-23 ENCOUNTER — Other Ambulatory Visit: Payer: Self-pay | Admitting: Sports Medicine

## 2022-07-23 DIAGNOSIS — R058 Other specified cough: Secondary | ICD-10-CM

## 2022-08-15 ENCOUNTER — Other Ambulatory Visit: Payer: Self-pay | Admitting: Sports Medicine

## 2022-08-15 DIAGNOSIS — J302 Other seasonal allergic rhinitis: Secondary | ICD-10-CM

## 2022-08-30 ENCOUNTER — Ambulatory Visit: Payer: BC Managed Care – PPO | Admitting: Sports Medicine

## 2022-08-30 ENCOUNTER — Encounter: Payer: Self-pay | Admitting: Sports Medicine

## 2022-08-30 VITALS — BP 130/78 | HR 67 | Temp 98.2°F | Wt 227.0 lb

## 2022-08-30 DIAGNOSIS — F4320 Adjustment disorder, unspecified: Secondary | ICD-10-CM | POA: Insufficient documentation

## 2022-08-30 DIAGNOSIS — R058 Other specified cough: Secondary | ICD-10-CM

## 2022-08-30 MED ORDER — HYDROCODONE BIT-HOMATROP MBR 5-1.5 MG/5ML PO SOLN: 5.0000 mL | Freq: Three times a day (TID) | ORAL | 0 refills | Status: DC | PRN

## 2022-08-30 MED ORDER — SERTRALINE HCL 50 MG PO TABS: ORAL_TABLET | ORAL | 3 refills | Status: DC

## 2022-08-30 MED ORDER — BENZONATATE 200 MG PO CAPS: ORAL_CAPSULE | ORAL | 11 refills | Status: DC

## 2022-08-30 MED ORDER — ALPRAZOLAM 0.5 MG PO TABS: 0.5000 mg | ORAL_TABLET | Freq: Two times a day (BID) | ORAL | 0 refills | Status: DC | PRN

## 2022-08-30 NOTE — Progress Notes (Signed)
    Procedures performed today:    None.  Independent interpretation of notes and tests performed by another provider:   None.  Brief History, Exam, Impression, and Recommendations:    Post-viral cough syndrome Jimmy Garcia returns, he was recently diagnosed with influenza at an urgent care, he was outside of the window for Tamiflu. He is starting to feel better but still has a cough, fatigue, and some anosmia. His COVID test was negative, I am going to refill his cough syrup, but he likely just have to wait this out.   Anticipatory grieving Jimmy Garcia has a lot of stressors in his life right now, his mother has end-stage Alzheimer's disease and is currently on hospice care, she will likely pass this coming year. 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 He is having nausea when reading work emails, and significant stress and uneasiness. He is currently experiencing an adjustment disorder with predominantly anxious mood, along with his anticipatory grieving, he does not have time to do behavioral therapy so we will start Zoloft low-dose and alprazolam for use as needed, return to see me in 6 weeks.    ____________________________________________ Ihor Austin. Benjamin Stain, M.D., ABFM., CAQSM., AME. Primary Care and Sports Medicine [facility]  Adjunct Professor of Family Medicine  [place] [employer] [facility] [facility] [employer]  Restaurant manager, fast food

## 2022-08-30 NOTE — Assessment & Plan Note (Signed)
Jimmy Garcia returns, he was recently diagnosed with influenza at an urgent care, he was outside of the window for Tamiflu. He is starting to feel better but still has a cough, fatigue, and some anosmia. His COVID test was negative, I am going to refill his cough syrup, but he likely just have to wait this out.

## 2022-08-30 NOTE — Assessment & Plan Note (Signed)
Jimmy Garcia has a lot of stressors in his life right now, his mother has end-stage Alzheimer's disease and is currently on hospice care, she will likely pass this coming year. 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 He is having nausea when reading work emails, and significant stress and uneasiness. He is currently experiencing an adjustment disorder with predominantly anxious mood, along with his anticipatory grieving, he does not have time to do behavioral therapy so we will start Zoloft low-dose and alprazolam for use as needed, return to see me in 6 weeks.

## 2022-10-11 ENCOUNTER — Ambulatory Visit: Payer: BC Managed Care – PPO | Admitting: Sports Medicine

## 2022-10-17 ENCOUNTER — Ambulatory Visit: Payer: BC Managed Care – PPO | Admitting: Sports Medicine

## 2022-10-17 ENCOUNTER — Encounter: Payer: Self-pay | Admitting: Sports Medicine

## 2022-10-17 VITALS — BP 124/80 | HR 64 | Wt 223.0 lb

## 2022-10-17 DIAGNOSIS — F4320 Adjustment disorder, unspecified: Secondary | ICD-10-CM | POA: Diagnosis not present

## 2022-10-17 DIAGNOSIS — M1731 Unilateral post-traumatic osteoarthritis, right knee: Secondary | ICD-10-CM | POA: Diagnosis not present

## 2022-10-17 MED ORDER — DICLOFENAC SODIUM 75 MG PO TBEC: 75.0000 mg | DELAYED_RELEASE_TABLET | Freq: Two times a day (BID) | ORAL | 3 refills | Status: DC

## 2022-10-17 NOTE — Assessment & Plan Note (Signed)
Symptoms under excellent control with Zoloft 150, return as needed for this.

## 2022-10-17 NOTE — Progress Notes (Signed)
    Procedures performed today:    None.  Independent interpretation of notes and tests performed by another provider:   None.  Brief History, Exam, Impression, and Recommendations:    Anticipatory grieving Symptoms under excellent control with Zoloft 150, return as needed for this.    ____________________________________________ Gwen Her. Dianah Field, M.D., ABFM., CAQSM., AME. Primary Care and Sports Medicine [facility]  Adjunct Professor of [place] [employer] [facility] [employer]  Risk manager

## 2022-11-29 ENCOUNTER — Other Ambulatory Visit: Payer: Self-pay | Admitting: Sports Medicine

## 2022-11-29 DIAGNOSIS — E669 Obesity, unspecified: Secondary | ICD-10-CM

## 2022-12-24 ENCOUNTER — Ambulatory Visit (INDEPENDENT_AMBULATORY_CARE_PROVIDER_SITE_OTHER): Payer: BC Managed Care – PPO | Admitting: Sports Medicine

## 2022-12-24 ENCOUNTER — Telehealth: Payer: Self-pay | Admitting: Sports Medicine

## 2022-12-24 ENCOUNTER — Encounter: Payer: Self-pay | Admitting: Sports Medicine

## 2022-12-24 VITALS — BP 109/72 | HR 65 | Ht 69.0 in | Wt 221.0 lb

## 2022-12-24 DIAGNOSIS — R739 Hyperglycemia, unspecified: Secondary | ICD-10-CM | POA: Diagnosis not present

## 2022-12-24 DIAGNOSIS — Z0000 Encounter for general adult medical examination without abnormal findings: Secondary | ICD-10-CM | POA: Diagnosis not present

## 2022-12-24 DIAGNOSIS — M1731 Unilateral post-traumatic osteoarthritis, right knee: Secondary | ICD-10-CM

## 2022-12-24 NOTE — Assessment & Plan Note (Signed)
Annual physical as above. Up to date on screenings

## 2022-12-24 NOTE — Progress Notes (Signed)
Subjective:    CC: Annual Physical Exam  HPI:  This patient is here for their annual physical  I reviewed the past medical history, family history, social history, surgical history, and allergies today and no changes were needed.  Please see the problem list section below in epic for further details.  Past Medical History: Past Medical History:  Diagnosis Date   Allergy    Arthritis    GERD (gastroesophageal reflux disease)    Past Surgical History: Past Surgical History:  Procedure Laterality Date   ANTERIOR CRUCIATE LIGAMENT REPAIR Right    01/2015   COLONOSCOPY     first one at age 82 but then age 36 possibly removed a polyp in [facility]. Last one done 2016   Social History: Social History   Socioeconomic History   Marital status: Married    Spouse name: Melven Stockard   Number of children: Not on file   Years of education: Not on file   Highest education level: Not on file  Occupational History   Occupation: Education  Tobacco Use   Smoking status: Never   Smokeless tobacco: Never  Vaping Use   Vaping Use: Never used  Substance and Sexual Activity   Alcohol use: Yes    Comment: occasional   Drug use: No   Sexual activity: Yes    Partners: Female  Other Topics Concern   Not on file  Social History Narrative   Not on file   Social Determinants of Health   Financial Resource Strain: Not on file  Food Insecurity: Not on file  Transportation Needs: Not on file  Physical Activity: Not on file  Stress: Not on file  Social Connections: Not on file   Family History: Family History  Problem Relation Age of Onset   Colon cancer Paternal Grandfather    Colon polyps Paternal Grandfather    Esophageal cancer Neg Hx    Allergies: Allergies  Allergen Reactions   Sulfa Antibiotics    Medications: See med rec.  Review of Systems: No headache, visual changes, nausea, vomiting, diarrhea, constipation, dizziness, abdominal  pain, skin rash, fevers, chills, night sweats, swollen lymph nodes, weight loss, chest pain, body aches, joint swelling, muscle aches, shortness of breath, mood changes, visual or auditory hallucinations.  Objective:    General: Well Developed, well nourished, and in no acute distress.  Neuro: Alert and oriented x3, extra-ocular muscles intact, sensation grossly intact. Cranial nerves II through XII are intact, motor, sensory, and coordinative functions are all intact. HEENT: Normocephalic, atraumatic, pupils equal round reactive to light, neck supple, no masses, no lymphadenopathy, thyroid nonpalpable. Oropharynx, nasopharynx, external ear canals are unremarkable. Skin: Warm and dry, no rashes noted.  Cardiac: Regular rate and rhythm, no murmurs rubs or gallops.  Respiratory: Clear to auscultation bilaterally. Not using accessory muscles, speaking in full sentences.  Abdominal: Soft, nontender, nondistended, positive bowel sounds, no masses, no organomegaly.  Musculoskeletal: Shoulder, elbow, wrist, hip, knee, ankle stable, and with full range of motion.  Impression and Recommendations:    The patient was counselled, risk factors were discussed, anticipatory guidance given.  Annual physical exam Annual physical as above. Up to date on screenings  Post-traumatic osteoarthritis of right knee Right knee started to hurt again, we finished Orthovisc in May of last year, we will go ahead and get him approved again and do steroid and Orthovisc for shot #1.   ____________________________________________ Ihor Austin. Benjamin Stain, M.D., ABFM., CAQSM., AME. Primary Care and Sports Medicine [facility]  Adjunct Professor of Family Medicine  [employer]  Restaurant manager, fast food

## 2022-12-24 NOTE — Assessment & Plan Note (Signed)
Right knee started to hurt again, we finished Orthovisc in May of last year, we will go ahead and get him approved again and do steroid and Orthovisc for shot #1.

## 2022-12-24 NOTE — Telephone Encounter (Signed)
Right knee Orthovisc please, did well last year, x-ray confirmed osteoarthritis, failed 6 weeks of physical therapy, analgesics, NSAIDs.

## 2022-12-25 LAB — COMPREHENSIVE METABOLIC PANEL
AG Ratio: 2.1 (calc) (ref 1.0–2.5)
ALT: 23 U/L (ref 9–46)
AST: 20 U/L (ref 10–40)
Albumin: 4.4 g/dL (ref 3.6–5.1)
Alkaline phosphatase (APISO): 55 U/L (ref 36–130)
BUN: 17 mg/dL (ref 7–25)
CO2: 23 mmol/L (ref 20–32)
Calcium: 9.1 mg/dL (ref 8.6–10.3)
Chloride: 109 mmol/L (ref 98–110)
Creat: 0.92 mg/dL (ref 0.60–1.29)
Globulin: 2.1 g/dL (calc) (ref 1.9–3.7)
Glucose, Bld: 80 mg/dL (ref 65–99)
Potassium: 4.3 mmol/L (ref 3.5–5.3)
Sodium: 139 mmol/L (ref 135–146)
Total Bilirubin: 0.6 mg/dL (ref 0.2–1.2)
Total Protein: 6.5 g/dL (ref 6.1–8.1)

## 2022-12-25 LAB — LIPID PANEL
Cholesterol: 141 mg/dL (ref <200)
HDL: 49 mg/dL (ref >=40)
LDL Cholesterol (Calc): 72 mg/dL (calc)
Non-HDL Cholesterol (Calc): 92 mg/dL (calc) (ref <130)
Total CHOL/HDL Ratio: 2.9 (calc) (ref <5.0)
Triglycerides: 112 mg/dL (ref <150)

## 2022-12-25 LAB — HEMOGLOBIN A1C
Hgb A1c MFr Bld: 5.1 % of total Hgb (ref <5.7)
Mean Plasma Glucose: 100 mg/dL
eAG (mmol/L): 5.5 mmol/L

## 2022-12-25 LAB — CBC
HCT: 45.9 % (ref 38.5–50.0)
Hemoglobin: 15.5 g/dL (ref 13.2–17.1)
MCH: 31.1 pg (ref 27.0–33.0)
MCHC: 33.8 g/dL (ref 32.0–36.0)
MCV: 92 fL (ref 80.0–100.0)
MPV: 11.1 fL (ref 7.5–12.5)
Platelets: 258 10*3/uL (ref 140–400)
RBC: 4.99 10*6/uL (ref 4.20–5.80)
RDW: 12.1 % (ref 11.0–15.0)
WBC: 5.8 10*3/uL (ref 3.8–10.8)

## 2022-12-25 LAB — TSH: TSH: 0.7 mIU/L (ref 0.40–4.50)

## 2022-12-27 NOTE — Telephone Encounter (Signed)
PA information submitted via [email or web address] for Orthovisc Paperwork has been printed and given to Dr. T for signatures. Once obtained, information will be faxed to [employer] at [phone]  

## 2023-01-03 NOTE — Telephone Encounter (Signed)
Benefits Investigation Details received from [employer] Injection: Orthovisc PA required: Yes May fill through: Buy and Bill [facility] Specialty Pharm[facility] Copay/Coinsurance: 0% Product Copay: $80 Administration Coinsurance: 0% Administration Copay: [employer]: [id number] (met: [id number])

## 2023-01-14 ENCOUNTER — Encounter: Payer: Self-pay | Admitting: Sports Medicine

## 2023-01-14 ENCOUNTER — Other Ambulatory Visit: Payer: Self-pay | Admitting: Sports Medicine

## 2023-01-14 DIAGNOSIS — F4320 Adjustment disorder, unspecified: Secondary | ICD-10-CM

## 2023-01-14 MED ORDER — ALPRAZOLAM 0.5 MG PO TABS
0.5000 mg | ORAL_TABLET | Freq: Two times a day (BID) | ORAL | 0 refills | Status: AC | PRN
Start: 2023-01-14 — End: ?

## 2023-01-14 MED ORDER — SERTRALINE HCL 100 MG PO TABS
150.0000 mg | ORAL_TABLET | Freq: Every day | ORAL | 3 refills | Status: DC
Start: 2023-01-14 — End: 2024-02-23

## 2023-02-18 ENCOUNTER — Other Ambulatory Visit: Payer: Self-pay | Admitting: Sports Medicine

## 2023-02-20 ENCOUNTER — Encounter: Payer: Self-pay | Admitting: Sports Medicine

## 2023-02-20 NOTE — Telephone Encounter (Signed)
Jimmy Garcia, any he updates on the Vis[employer] for this dude? He's asking.

## 2023-04-14 DIAGNOSIS — M546 Pain in thoracic spine: Secondary | ICD-10-CM | POA: Diagnosis not present

## 2023-04-29 ENCOUNTER — Other Ambulatory Visit: Payer: Self-pay | Admitting: Sports Medicine

## 2023-04-29 DIAGNOSIS — J302 Other seasonal allergic rhinitis: Secondary | ICD-10-CM

## 2023-05-13 DIAGNOSIS — M546 Pain in thoracic spine: Secondary | ICD-10-CM | POA: Diagnosis not present

## 2023-05-13 DIAGNOSIS — Z79891 Long term (current) use of opiate analgesic: Secondary | ICD-10-CM | POA: Diagnosis not present

## 2023-06-03 DIAGNOSIS — T85618A Breakdown (mechanical) of other specified internal prosthetic devices, implants and grafts, initial encounter: Secondary | ICD-10-CM | POA: Diagnosis not present

## 2023-06-03 DIAGNOSIS — H9313 Tinnitus, bilateral: Secondary | ICD-10-CM | POA: Diagnosis not present

## 2023-06-03 DIAGNOSIS — T85698A Other mechanical complication of other specified internal prosthetic devices, implants and grafts, initial encounter: Secondary | ICD-10-CM | POA: Diagnosis not present

## 2023-06-03 DIAGNOSIS — H90A11 Conductive hearing loss, unilateral, right ear with restricted hearing on the contralateral side: Secondary | ICD-10-CM | POA: Diagnosis not present

## 2023-06-03 DIAGNOSIS — H90A22 Sensorineural hearing loss, unilateral, left ear, with restricted hearing on the contralateral side: Secondary | ICD-10-CM | POA: Diagnosis not present

## 2023-06-09 DIAGNOSIS — M546 Pain in thoracic spine: Secondary | ICD-10-CM | POA: Diagnosis not present

## 2023-06-23 DIAGNOSIS — L578 Other skin changes due to chronic exposure to nonionizing radiation: Secondary | ICD-10-CM | POA: Diagnosis not present

## 2023-06-23 DIAGNOSIS — D1801 Hemangioma of skin and subcutaneous tissue: Secondary | ICD-10-CM | POA: Diagnosis not present

## 2023-06-23 DIAGNOSIS — W908XXS Exposure to other nonionizing radiation, sequela: Secondary | ICD-10-CM | POA: Diagnosis not present

## 2023-06-23 DIAGNOSIS — L821 Other seborrheic keratosis: Secondary | ICD-10-CM | POA: Diagnosis not present

## 2023-06-25 ENCOUNTER — Other Ambulatory Visit: Payer: Self-pay | Admitting: Sports Medicine

## 2023-06-25 DIAGNOSIS — M1731 Unilateral post-traumatic osteoarthritis, right knee: Secondary | ICD-10-CM

## 2023-07-14 DIAGNOSIS — M546 Pain in thoracic spine: Secondary | ICD-10-CM | POA: Diagnosis not present

## 2023-08-14 DIAGNOSIS — M546 Pain in thoracic spine: Secondary | ICD-10-CM | POA: Diagnosis not present

## 2023-08-14 DIAGNOSIS — Z79891 Long term (current) use of opiate analgesic: Secondary | ICD-10-CM | POA: Diagnosis not present

## 2023-09-10 ENCOUNTER — Encounter: Payer: Self-pay | Admitting: Family Medicine

## 2023-09-10 ENCOUNTER — Ambulatory Visit: Payer: BC Managed Care – PPO | Admitting: Family Medicine

## 2023-09-10 VITALS — BP 115/81 | HR 57 | Ht 69.0 in | Wt 225.0 lb

## 2023-09-10 DIAGNOSIS — R058 Other specified cough: Secondary | ICD-10-CM | POA: Diagnosis not present

## 2023-09-10 DIAGNOSIS — R053 Chronic cough: Secondary | ICD-10-CM

## 2023-09-10 DIAGNOSIS — J329 Chronic sinusitis, unspecified: Secondary | ICD-10-CM | POA: Insufficient documentation

## 2023-09-10 MED ORDER — IPRATROPIUM BROMIDE 0.06 % NA SOLN: 2.0000 | Freq: Three times a day (TID) | NASAL | 3 refills | Status: AC

## 2023-09-10 MED ORDER — BENZONATATE 200 MG PO CAPS
ORAL_CAPSULE | ORAL | 11 refills | Status: DC
Start: 2023-09-10 — End: 2024-07-05

## 2023-09-10 MED ORDER — MUPIROCIN 2 % EX OINT: 1.0000 | TOPICAL_OINTMENT | Freq: Two times a day (BID) | CUTANEOUS | 0 refills | Status: AC

## 2023-09-10 NOTE — Assessment & Plan Note (Signed)
 Turbinates are fairly erythematous.  Will add 1 week of topical mupirocin ointment and start ipratropium nasal spray to see if this provides better relief with postnasal drainage.  Tessalon Perles renewed.

## 2023-09-10 NOTE — Patient Instructions (Signed)
 Use mupirocin ointment twice per day for 7 days.  Start ipratropium nasal spray.  Tessalon as needed.

## 2023-09-10 NOTE — Progress Notes (Signed)
 Jimmy Garcia - 53 y.o. male MRN [id number]  Date of birth: 15-May-1971  Subjective Chief Complaint  Patient presents with   Cough    HPI Jimmy Garcia is a 53 y.o. male here today with complaint of cough.  He has history of chronic cough.  His PCP has been prescribing Tessalon  Perles as needed.  Recently ran out of these and is requesting a refill.  His cough is from chronic postnasal drainage.  He has tried Flonase  without relief.  He has not tried any other nasal sprays.  He is taking Singulair .  He denies dyspnea or wheezing.    ROS:  A comprehensive ROS was completed and negative except as noted per HPI    Allergies  Allergen Reactions   Sulfa Antibiotics     Past Medical History:  Diagnosis Date   Allergy    Arthritis    GERD (gastroesophageal reflux disease)     Past Surgical History:  Procedure Laterality Date   ANTERIOR CRUCIATE LIGAMENT REPAIR Right    01/2015   COLONOSCOPY     first one at age 79 but then age 89 possibly removed a polyp in [facility]. Last one done 2016    Social History   Socioeconomic History   Marital status: Married    Spouse name: Hassel Uphoff   Number of children: Not on file   Years of education: Not on file   Highest education level: Not on file  Occupational History   Occupation: Education  Tobacco Use   Smoking status: Never   Smokeless tobacco: Never  Vaping Use   Vaping status: Never Used  Substance and Sexual Activity   Alcohol use: Yes    Comment: occasional   Drug use: No   Sexual activity: Yes    Partners: Female  Other Topics Concern   Not on file  Social History Narrative   Not on file   Social Drivers of Health   Financial Resource Strain: Not on file  Food Insecurity: Not on file  Transportation Needs: Not on file  Physical Activity: Not on file  Stress: Not on file  Social Connections: Unknown (01/12/2022)   [facility] from [facility]   Social  Network    Social Network: Not on file    Family History  Problem Relation Age of Onset   Colon cancer Paternal Grandfather    Colon polyps Paternal Grandfather    Esophageal cancer Neg Hx     Health Maintenance  Topic Date Due   Hepatitis C Screening  Never done   Zoster Vaccines- Shingrix  (1 of 2) Never done   Colonoscopy  10/18/2024   DTaP/Tdap/Td (5 - Td or Tdap) 11/04/2031   INFLUENZA VACCINE  Completed   COVID-19 Vaccine  Completed   HIV Screening  Completed   HPV VACCINES  Aged Out     ----------------------------------------------------------------------------------------------------------------------------------------------------------------------------------------------------------------- Physical Exam BP 115/81 (BP Location: Left Arm, Patient Position: Sitting, Cuff Size: Normal)   Pulse (!) 57   Ht 5' 9 (1.753 m)   Wt 225 lb (102.1 kg)   SpO2 98%   BMI 33.23 kg/m   Physical Exam Constitutional:      Appearance: Normal appearance.  HENT:     Head: Normocephalic and atraumatic.     Right Ear: Tympanic membrane normal.     Left Ear: Tympanic membrane normal.     Nose:     Comments: Erythematous turbinates. Eyes:     General: No scleral  icterus. Cardiovascular:     Rate and Rhythm: Normal rate and regular rhythm.  Pulmonary:     Effort: Pulmonary effort is normal.     Breath sounds: Normal breath sounds.  Neurological:     Mental Status: He is alert.  Psychiatric:        Mood and Affect: Mood normal.        Behavior: Behavior normal.     ------------------------------------------------------------------------------------------------------------------------------------------------------------------------------------------------------------------- Assessment and Plan  Chronic cough Turbinates are fairly erythematous.  Will add 1 week of topical mupirocin  ointment and start ipratropium nasal spray to see if this provides better relief with postnasal  drainage.  Tessalon  Perles renewed.   Meds ordered this encounter  Medications   benzonatate  (TESSALON ) 200 MG capsule    Sig: TAKE ONE CAPSULE (200MG  TOTAL) BY MOUTH 3 TIMES DAILY AS NEEDED FOR COUGH    Dispense:  45 capsule    Refill:  11   ipratropium (ATROVENT ) 0.06 % nasal spray    Sig: Place 2 sprays into both nostrils 3 (three) times daily.    Dispense:  15 mL    Refill:  3   mupirocin  ointment (BACTROBAN ) 2 %    Sig: Apply 1 Application topically 2 (two) times daily for 7 days.    Dispense:  22 g    Refill:  0    No follow-ups on file.    This visit occurred during the SARS-CoV-2 public health emergency.  Safety protocols were in place, including screening questions prior to the visit, additional usage of staff PPE, and extensive cleaning of exam room while observing appropriate contact time as indicated for disinfecting solutions.

## 2023-09-11 ENCOUNTER — Encounter: Payer: Self-pay | Admitting: Family Medicine

## 2023-09-11 MED ORDER — PROMETHAZINE-DM 6.25-15 MG/5ML PO SYRP: 5.0000 mL | ORAL_SOLUTION | Freq: Four times a day (QID) | ORAL | 0 refills | Status: DC | PRN

## 2023-09-15 ENCOUNTER — Encounter: Payer: Self-pay | Admitting: Sports Medicine

## 2023-09-15 ENCOUNTER — Ambulatory Visit (INDEPENDENT_AMBULATORY_CARE_PROVIDER_SITE_OTHER): Payer: BC Managed Care – PPO | Admitting: Sports Medicine

## 2023-09-15 VITALS — BP 134/86 | HR 78 | Temp 97.9°F

## 2023-09-15 DIAGNOSIS — J40 Bronchitis, not specified as acute or chronic: Secondary | ICD-10-CM

## 2023-09-15 DIAGNOSIS — J329 Chronic sinusitis, unspecified: Secondary | ICD-10-CM | POA: Diagnosis not present

## 2023-09-15 MED ORDER — PREDNISONE 50 MG PO TABS: ORAL_TABLET | ORAL | 0 refills | Status: DC

## 2023-09-15 MED ORDER — HYDROCOD POLI-CHLORPHE POLI ER 10-8 MG/5ML PO SUER: 5.0000 mL | Freq: Two times a day (BID) | ORAL | 0 refills | Status: DC | PRN

## 2023-09-15 MED ORDER — AMOXICILLIN-POT CLAVULANATE 875-125 MG PO TABS: 1.0000 | ORAL_TABLET | Freq: Two times a day (BID) | ORAL | 0 refills | Status: DC

## 2023-09-15 NOTE — Progress Notes (Signed)
    Procedures performed today:    None.  Independent interpretation of notes and tests performed by another provider:   None.  Brief History, Exam, Impression, and Recommendations:    Sinobronchitis This is a pleasant previously healthy 53 year old male, for 2 weeks now he has had nocturnal cough, facial pain and pressure predominantly left-sided. Malaise. He saw one of my partners and was treated conservatively appropriately. Unfortunately continues to have symptoms, is worse symptom is left-sided facial pressure, pain in the ear. He does have a cough at night. We will escalate treatment due to duration and symptomatology to prednisone , Augmentin  and Tussionex. Return to see me in 2 to 3 weeks as needed.    ____________________________________________ Debby PARAS. Curtis, M.D., ABFM., CAQSM., AME. Primary Care and Sports Medicine [facility]  Adjunct Professor of [facility] Medicine  [employer]  Restaurant Manager, [employer]

## 2023-09-15 NOTE — Assessment & Plan Note (Signed)
 This is a pleasant previously healthy 53 year old male, for 2 weeks now he has had nocturnal cough, facial pain and pressure predominantly left-sided. Malaise. He saw one of my partners and was treated conservatively appropriately. Unfortunately continues to have symptoms, is worse symptom is left-sided facial pressure, pain in the ear. He does have a cough at night. We will escalate treatment due to duration and symptomatology to prednisone , Augmentin  and Tussionex. Return to see me in 2 to 3 weeks as needed.

## 2023-09-19 ENCOUNTER — Ambulatory Visit: Payer: BC Managed Care – PPO | Admitting: Sports Medicine

## 2023-09-24 ENCOUNTER — Other Ambulatory Visit: Payer: Self-pay | Admitting: Sports Medicine

## 2023-09-24 DIAGNOSIS — R6889 Other general symptoms and signs: Secondary | ICD-10-CM

## 2023-10-08 ENCOUNTER — Other Ambulatory Visit: Payer: Self-pay | Admitting: Sports Medicine

## 2023-10-08 DIAGNOSIS — J302 Other seasonal allergic rhinitis: Secondary | ICD-10-CM

## 2023-10-27 ENCOUNTER — Encounter: Payer: Self-pay | Admitting: Sports Medicine

## 2023-10-27 ENCOUNTER — Ambulatory Visit: Payer: No Typology Code available for payment source | Admitting: Sports Medicine

## 2023-10-27 VITALS — Ht 69.0 in | Wt 224.0 lb

## 2023-10-27 DIAGNOSIS — M7521 Bicipital tendinitis, right shoulder: Secondary | ICD-10-CM | POA: Diagnosis not present

## 2023-10-27 DIAGNOSIS — E669 Obesity, unspecified: Secondary | ICD-10-CM

## 2023-10-27 MED ORDER — IBUPROFEN 200 MG PO TABS: 800.0000 mg | ORAL_TABLET | Freq: Three times a day (TID) | ORAL | Status: AC

## 2023-10-27 MED ORDER — ZEPBOUND 2.5 MG/0.5ML ~~LOC~~ SOAJ: 2.5000 mg | SUBCUTANEOUS | 0 refills | Status: DC

## 2023-10-27 NOTE — Assessment & Plan Note (Signed)
 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 He has good motion and good strength however I able to reproduce pain with resisted supination of the right forearm He also has tenderness at the distal biceps insertion at the radial tuberosity. He will do over-the-counter ibuprofen and I will add some home PT, return to see me in 6 weeks for this, dorsal approach ultrasound-guided injection if not better.

## 2023-10-27 NOTE — Progress Notes (Signed)
    Procedures performed today:    None.  Independent interpretation of notes and tests performed by another provider:   None.  Brief History, Exam, Impression, and Recommendations:    Obesity (BMI 30-39.9) This is a very pleasant 53 year old male, he has struggled with his weight for some time now, historically he has been on Saxenda, did not have sufficient weight loss. He will be enrolled in a multidisciplinary weight loss program. He will be doing calorie counting, and exercise prescription. He has no contraindications to the use of a GLP-1 agent, and he will not be taking any other weight loss medications concurrently. We will try to get him Zepbound, if unable [facility], if unable Saxenda, if unable compounded semaglutide.  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 He has good motion and good strength however I able to reproduce pain with resisted supination of the right forearm He also has tenderness at the distal biceps insertion at the radial tuberosity. He will do over-the-counter ibuprofen and I will add some home PT, return to see me in 6 weeks for this, dorsal approach ultrasound-guided injection if not better.    ____________________________________________ Ihor Austin. Benjamin Stain, M.D., ABFM., CAQSM., AME. Primary Care and Sports Medicine [facility]  Adjunct Professor of Family Medicine  [place] [employer] [facility] [employer]  Restaurant manager, fast food

## 2023-10-27 NOTE — Assessment & Plan Note (Signed)
 This is a very pleasant 53 year old male, he has struggled with his weight for some time now, historically he has been on Saxenda, did not have sufficient weight loss. He will be enrolled in a multidisciplinary weight loss program. He will be doing calorie counting, and exercise prescription. He has no contraindications to the use of a GLP-1 agent, and he will not be taking any other weight loss medications concurrently. We will try to get him Zepbound, if unable [facility], if unable Saxenda, if unable compounded semaglutide.

## 2023-11-24 ENCOUNTER — Encounter: Payer: Self-pay | Admitting: Sports Medicine

## 2023-11-24 ENCOUNTER — Ambulatory Visit: Payer: No Typology Code available for payment source | Admitting: Sports Medicine

## 2023-11-24 VITALS — BP 108/74 | HR 60 | Resp 20 | Ht 69.0 in | Wt 228.0 lb

## 2023-11-24 DIAGNOSIS — E669 Obesity, unspecified: Secondary | ICD-10-CM | POA: Diagnosis not present

## 2023-11-24 MED ORDER — ZEPBOUND 2.5 MG/0.5ML ~~LOC~~ SOAJ: 2.5000 mg | SUBCUTANEOUS | 0 refills | Status: DC

## 2023-11-24 NOTE — Progress Notes (Signed)
    Procedures performed today:    None.  Independent interpretation of notes and tests performed by another provider:   None.  Brief History, Exam, Impression, and Recommendations:    Obesity (BMI 30-39.9) This is a very pleasant 53 year old male, he has struggled with his weight for some time now, historically he has been on Saxenda, did not have sufficient weight loss. He will be enrolled in a multidisciplinary weight loss program. He will be doing calorie counting, and exercise prescription. He has no contraindications to the use of a GLP-1 agent, and he will not be taking any other weight loss medications concurrently. We will try to get him Zepbound.   Updated history: we were unable to get Zepbound from his current pharmacy, he never heard anything back so I do not know if there was a PA or if it failed, he would like me to send it into a different pharmacy and if were unable to get it approved we will switch to compounded tirzepatide.    ____________________________________________ Ihor Austin. Benjamin Stain, M.D., ABFM., CAQSM., AME. Primary Care and Sports Medicine [facility]  Adjunct Professor of Family Medicine  [place] [employer] [facility] [employer] Medicine  Restaurant manager, fast food

## 2023-11-24 NOTE — Assessment & Plan Note (Signed)
 This is a very pleasant 52 year old male, he has struggled with his weight for some time now, historically he has been on Saxenda, did not have sufficient weight loss. He will be enrolled in a multidisciplinary weight loss program. He will be doing calorie counting, and exercise prescription. He has no contraindications to the use of a GLP-1 agent, and he will not be taking any other weight loss medications concurrently. We will try to get him Zepbound.   Updated history: we were unable to get Zepbound from his current pharmacy, he never heard anything back so I do not know if there was a PA or if it failed, he would like me to send it into a different pharmacy and if were unable to get it approved we will switch to compounded tirzepatide.

## 2023-11-26 ENCOUNTER — Telehealth: Payer: Self-pay | Admitting: Pharmacy Technician

## 2023-11-26 ENCOUNTER — Other Ambulatory Visit (HOSPITAL_COMMUNITY): Payer: Self-pay

## 2023-11-26 NOTE — Telephone Encounter (Signed)
 Pharmacy Patient Advocate Encounter  Received notification from [employer] that Prior Authorization for Zepbound 2.5MG /0.5ML pen-injectors has been APPROVED from 11/26/23 to 11/25/24. Ran test claim, Copay is $30. This test claim was processed through [facility]- copay amounts may vary at other pharmacies due to pharmacy/plan contracts, or as the patient moves through the different stages of their insurance plan.   PA #/Case ID/Reference #: [id number]

## 2023-11-26 NOTE — Telephone Encounter (Signed)
 Pharmacy Patient Advocate Encounter   Received notification from [employer] that prior authorization for Zepbound 2.5MG /0.5ML pen-injectors is required/requested.   Insurance verification completed.   The patient is insured through [employer] .   Per test claim: PA required; PA submitted to above mentioned insurance via CoverMyMeds Key/confirmation #/EOC [id number] Status is pending

## 2023-12-14 ENCOUNTER — Encounter: Payer: Self-pay | Admitting: Sports Medicine

## 2023-12-17 ENCOUNTER — Ambulatory Visit: Admitting: Sports Medicine

## 2023-12-17 ENCOUNTER — Encounter: Payer: Self-pay | Admitting: Sports Medicine

## 2023-12-17 VITALS — BP 107/71 | HR 55 | Resp 20 | Ht 69.0 in | Wt 215.0 lb

## 2023-12-17 DIAGNOSIS — Z23 Encounter for immunization: Secondary | ICD-10-CM

## 2023-12-17 DIAGNOSIS — M7752 Other enthesopathy of left foot: Secondary | ICD-10-CM

## 2023-12-17 DIAGNOSIS — Z0000 Encounter for general adult medical examination without abnormal findings: Secondary | ICD-10-CM

## 2023-12-17 DIAGNOSIS — E669 Obesity, unspecified: Secondary | ICD-10-CM | POA: Diagnosis not present

## 2023-12-17 MED ORDER — ZEPBOUND 10 MG/0.5ML ~~LOC~~ SOAJ: 10.0000 mg | SUBCUTANEOUS | 0 refills | Status: DC

## 2023-12-17 MED ORDER — ZEPBOUND 7.5 MG/0.5ML ~~LOC~~ SOAJ: 7.5000 mg | SUBCUTANEOUS | 0 refills | Status: DC

## 2023-12-17 MED ORDER — ZEPBOUND 5 MG/0.5ML ~~LOC~~ SOAJ: 5.0000 mg | SUBCUTANEOUS | 0 refills | Status: DC

## 2023-12-17 MED ORDER — ZEPBOUND 5 MG/0.5ML ~~LOC~~ SOAJ
5.0000 mg | SUBCUTANEOUS | 0 refills | Status: DC
Start: 2023-12-17 — End: 2024-01-29

## 2023-12-17 MED ORDER — ZEPBOUND 7.5 MG/0.5ML ~~LOC~~ SOAJ
7.5000 mg | SUBCUTANEOUS | 0 refills | Status: DC
Start: 2023-12-17 — End: 2023-12-17

## 2023-12-17 NOTE — Progress Notes (Signed)
    Procedures performed today:    None.  Independent interpretation of notes and tests performed by another provider:   None.  Brief History, Exam, Impression, and Recommendations:    Annual physical exam Shingrix No. 1 today, return in nurse visit 2 to 6 months for Shingrix #2  Obesity (BMI 30-39.9) Harjot was able to get his Zepbound, he has lost a great deal of weight, we will going to call in the 5, 7.5, and 10 mg dosage forms, I will see him back in 3 months to evaluate tolerance and weight loss.  Tendinitis of left flexor hallucis longus Several weeks of increasing pain medial plantar arch, not worse in the mornings. On exam there is no tenderness over the plantar fascial origin. No tenderness at the navicular prominence, we are able to reproduce his pain with resisted flexion of the great toe. We will add first metatarsal ray posts, he can use over-the-counter analgesics and NSAIDs, I would like x-rays, home PT, return to see me in 6 weeks.    ____________________________________________ Joselyn Nicely. Sandy Crumb, M.D., ABFM., CAQSM., AME. Primary Care and Sports Medicine [facility]  Adjunct Professor of [facility] Medicine  [employer]  Restaurant manager, fast food

## 2023-12-17 NOTE — Assessment & Plan Note (Signed)
 Shingrix No. 1 today, return in nurse visit 2 to 6 months for Shingrix #2

## 2023-12-17 NOTE — Assessment & Plan Note (Signed)
 Jimmy Garcia was able to get his Zepbound, he has lost a great deal of weight, we will going to call in the 5, 7.5, and 10 mg dosage forms, I will see him back in 3 months to evaluate tolerance and weight loss.

## 2023-12-17 NOTE — Assessment & Plan Note (Signed)
 Several weeks of increasing pain medial plantar arch, not worse in the mornings. On exam there is no tenderness over the plantar fascial origin. No tenderness at the navicular prominence, we are able to reproduce his pain with resisted flexion of the great toe. We will add first metatarsal ray posts, he can use over-the-counter analgesics and NSAIDs, I would like x-rays, home PT, return to see me in 6 weeks.

## 2024-01-29 ENCOUNTER — Encounter: Payer: Self-pay | Admitting: Sports Medicine

## 2024-01-29 ENCOUNTER — Ambulatory Visit: Admitting: Sports Medicine

## 2024-01-29 DIAGNOSIS — E669 Obesity, unspecified: Secondary | ICD-10-CM | POA: Diagnosis not present

## 2024-01-29 DIAGNOSIS — M7752 Other enthesopathy of left foot: Secondary | ICD-10-CM

## 2024-01-29 NOTE — Assessment & Plan Note (Signed)
 Resolved with over-the-counter orthotics, first metatarsal ray posting, home PT. Return to see me as needed.

## 2024-01-29 NOTE — Assessment & Plan Note (Signed)
 Currently on 7.5 Zepbound , continuing to lose a good amount of weight. Return as needed for this.

## 2024-01-29 NOTE — Progress Notes (Signed)
    Procedures performed today:    None.  Independent interpretation of notes and tests performed by another provider:   None.  Brief History, Exam, Impression, and Recommendations:    Tendinitis of left flexor hallucis longus Resolved with over-the-counter orthotics, first metatarsal ray posting, home PT. Return to see me as needed.  Obesity (BMI 30-39.9) Currently on 7.5 Zepbound , continuing to lose a good amount of weight. Return as needed for this.    ____________________________________________ Joselyn Nicely. Sandy Crumb, M.D., ABFM., CAQSM., AME. Primary Care and Sports Medicine [facility]  Adjunct Professor of [facility] Medicine  [employer]  Restaurant manager, fast food

## 2024-02-20 ENCOUNTER — Other Ambulatory Visit: Payer: Self-pay | Admitting: Sports Medicine

## 2024-02-20 DIAGNOSIS — F4320 Adjustment disorder, unspecified: Secondary | ICD-10-CM

## 2024-02-23 NOTE — Telephone Encounter (Signed)
 Please advise on refill request

## 2024-03-08 ENCOUNTER — Other Ambulatory Visit: Payer: Self-pay | Admitting: Medical Genetics

## 2024-03-15 ENCOUNTER — Ambulatory Visit: Admitting: Sports Medicine

## 2024-03-15 VITALS — BP 104/74 | HR 65 | Resp 20 | Ht 69.0 in | Wt 203.0 lb

## 2024-03-15 DIAGNOSIS — Z0000 Encounter for general adult medical examination without abnormal findings: Secondary | ICD-10-CM

## 2024-03-15 DIAGNOSIS — Z23 Encounter for immunization: Secondary | ICD-10-CM

## 2024-03-15 DIAGNOSIS — E669 Obesity, unspecified: Secondary | ICD-10-CM | POA: Diagnosis not present

## 2024-03-15 MED ORDER — ZEPBOUND 10 MG/0.5ML ~~LOC~~ SOAJ: 10.0000 mg | SUBCUTANEOUS | 11 refills | Status: DC

## 2024-03-15 NOTE — Addendum Note (Signed)
 Addended by: OLIVA-AVELLANEDA, Henya Aguallo L on: 03/15/2024 10:49 AM   Modules accepted: Orders

## 2024-03-15 NOTE — Assessment & Plan Note (Signed)
 Fantastic weight loss, over 30 pounds, currently on Zepbound  10. No adverse effects, continues to lose weight, we will continue the medication for now.

## 2024-03-15 NOTE — Addendum Note (Signed)
 Addended by: CURTIS DEBBY PARAS on: 03/15/2024 09:38 AM   Modules accepted: Orders

## 2024-03-15 NOTE — Progress Notes (Signed)
    Procedures performed today:    None.  Independent interpretation of notes and tests performed by another provider:   None.  Brief History, Exam, Impression, and Recommendations:    Obesity (BMI 30-39.9) Fantastic weight loss, over 30 pounds, currently on Zepbound  10. No adverse effects, continues to lose weight, we will continue the medication for now.  Annual physical exam Shingrix  No. 2 today.    ____________________________________________ Debby PARAS. Curtis, M.D., ABFM., CAQSM., AME. Primary Care and Sports Medicine [facility]  Adjunct Professor of [facility] Medicine  [employer]  Restaurant manager, fast food

## 2024-03-15 NOTE — Assessment & Plan Note (Signed)
 Shingrix  No. 2 today.

## 2024-03-16 ENCOUNTER — Other Ambulatory Visit: Payer: Self-pay | Admitting: Sports Medicine

## 2024-03-16 DIAGNOSIS — E669 Obesity, unspecified: Secondary | ICD-10-CM

## 2024-03-16 MED ORDER — TOPIRAMATE 50 MG PO TABS: ORAL_TABLET | ORAL | 3 refills | Status: DC

## 2024-03-16 NOTE — Telephone Encounter (Signed)
 I have refilled topiramate  but we probably should not start at 100 mg, we will start back at the low-dose and titrate upward slowly.

## 2024-03-22 ENCOUNTER — Other Ambulatory Visit: Payer: Self-pay

## 2024-03-22 DIAGNOSIS — E669 Obesity, unspecified: Secondary | ICD-10-CM

## 2024-03-22 MED ORDER — TOPIRAMATE 50 MG PO TABS: ORAL_TABLET | ORAL | 3 refills | Status: DC

## 2024-03-23 ENCOUNTER — Encounter: Payer: Self-pay | Admitting: Sports Medicine

## 2024-04-12 ENCOUNTER — Other Ambulatory Visit: Payer: Self-pay | Admitting: Sports Medicine

## 2024-04-12 DIAGNOSIS — J302 Other seasonal allergic rhinitis: Secondary | ICD-10-CM

## 2024-05-04 ENCOUNTER — Encounter: Payer: Self-pay | Admitting: Sports Medicine

## 2024-06-21 ENCOUNTER — Other Ambulatory Visit: Payer: Self-pay | Admitting: Medical Genetics

## 2024-06-21 DIAGNOSIS — Z006 Encounter for examination for normal comparison and control in clinical research program: Secondary | ICD-10-CM

## 2024-06-23 ENCOUNTER — Other Ambulatory Visit: Payer: Self-pay

## 2024-07-05 ENCOUNTER — Ambulatory Visit: Admitting: Family Medicine

## 2024-07-05 ENCOUNTER — Encounter: Payer: Self-pay | Admitting: Family Medicine

## 2024-07-05 VITALS — BP 108/74 | HR 58 | Ht 69.0 in | Wt 201.0 lb

## 2024-07-05 DIAGNOSIS — R5383 Other fatigue: Secondary | ICD-10-CM | POA: Insufficient documentation

## 2024-07-05 DIAGNOSIS — E669 Obesity, unspecified: Secondary | ICD-10-CM | POA: Diagnosis not present

## 2024-07-05 DIAGNOSIS — F524 Premature ejaculation: Secondary | ICD-10-CM

## 2024-07-05 MED ORDER — TOPIRAMATE 100 MG PO TABS: ORAL_TABLET | ORAL | 3 refills | Status: DC

## 2024-07-05 MED ORDER — TOPIRAMATE 100 MG PO TABS: ORAL_TABLET | ORAL | 3 refills | Status: AC

## 2024-07-05 NOTE — Assessment & Plan Note (Signed)
 Doing well with topiramate  and Zepbound .  Will plan to continue this at current strength.

## 2024-07-05 NOTE — Assessment & Plan Note (Signed)
 Orders Placed This Encounter  Procedures   CMP14+EGFR   CBC with Differential/Platelet   PSA   Lipid Panel With LDL/HDL Ratio   TSH   Vitamin D  (25 hydroxy)   Testosterone

## 2024-07-05 NOTE — Assessment & Plan Note (Signed)
 Sertraline  is working well for him.  Will plan to continue at current strength.

## 2024-07-05 NOTE — Progress Notes (Signed)
 Jimmy Garcia - 53 y.o. male MRN [id number]  Date of birth: 1971-04-08  Subjective Chief Complaint  Patient presents with   Medication Refill    HPI Jimmy Garcia is a 53 y.o. male here today for follow up visit.   He is a former patient of Dr. Curtis.  He has been on Zepbound  and topiramate  to help with weight management.  Reports he is doing well with these at current strength.  No side effects at this time.  He has had pretty a weight loss and still has good appetite suppression at current strength.  Using sertraline  for history of depression as well as history of premature ejaculation.  This is working pretty well for him.  No side effects of current strength.  ROS:  A comprehensive ROS was completed and negative except as noted per HPI    Allergies  Allergen Reactions   Sulfa Antibiotics     Past Medical History:  Diagnosis Date   Allergy    Arthritis    GERD (gastroesophageal reflux disease)     Past Surgical History:  Procedure Laterality Date   ANTERIOR CRUCIATE LIGAMENT REPAIR Right    01/2015   COLONOSCOPY     first one at age 84 but then age 42 possibly removed a polyp in [facility]. Last one done 2016    Social History   Socioeconomic History   Marital status: Married    Spouse name: Keldon Lassen   Number of children: Not on file   Years of education: Not on file   Highest education level: Not on file  Occupational History   Occupation: Education  Tobacco Use   Smoking status: Never   Smokeless tobacco: Never  Vaping Use   Vaping status: Never Used  Substance and Sexual Activity   Alcohol use: Yes    Comment: occasional   Drug use: No   Sexual activity: Yes    Partners: Female  Other Topics Concern   Not on file  Social History Narrative   Not on file   Social Drivers of Health   Financial Resource Strain: Not on file  Food Insecurity: Not on file  Transportation Needs: Not on file  Physical  Activity: Not on file  Stress: Not on file  Social Connections: Unknown (01/12/2022)   Received from [facility]   Social Network    Social Network: Not on file    Family History  Problem Relation Age of Onset   Colon cancer Paternal Grandfather    Colon polyps Paternal Grandfather    Esophageal cancer Neg Hx     Health Maintenance  Topic Date Due   Colonoscopy  10/18/2024   Hepatitis C Screening  10/26/2024 (Originally 03/16/1989)   Influenza Vaccine  11/30/2024 (Originally 04/02/2024)   Pneumococcal Vaccine: 50+ Years (1 of 1 - PCV) 07/05/2025 (Originally 03/16/2023)   Hepatitis B Vaccines 19-59 Average Risk (1 of 3 - 19+ 3-dose series) 07/05/2025 (Originally 03/15/1992)   COVID-19 Vaccine (6 - 2025-26 season) 07/21/2025 (Originally 05/03/2024)   DTaP/Tdap/Td (5 - Td or Tdap) 11/04/2031   HIV Screening  Completed   Zoster Vaccines- Shingrix   Completed   HPV VACCINES  Aged Out   Meningococcal B Vaccine  Aged Out     ----------------------------------------------------------------------------------------------------------------------------------------------------------------------------------------------------------------- Physical Exam BP 108/74 (BP Location: Left Arm, Patient Position: Sitting, Cuff Size: Normal)   Pulse (!) 58   Ht 5' 9 (1.753 m)   Wt 201 lb (91.2 kg)   SpO2 97%  BMI 29.68 kg/m   Physical Exam Constitutional:      Appearance: Normal appearance.  HENT:     Head: Normocephalic and atraumatic.  Eyes:     General: No scleral icterus. Cardiovascular:     Rate and Rhythm: Normal rate and regular rhythm.  Pulmonary:     Effort: Pulmonary effort is normal.     Breath sounds: Normal breath sounds.  Musculoskeletal:     Cervical back: Neck supple.  Neurological:     Mental Status: He is alert.  Psychiatric:        Mood and Affect: Mood normal.        Behavior: Behavior normal.      ------------------------------------------------------------------------------------------------------------------------------------------------------------------------------------------------------------------- Assessment and Plan  Premature ejaculation Sertraline  is working well for him.  Will plan to continue at current strength.  Obesity (BMI 30-39.9) Doing well with topiramate  and Zepbound .  Will plan to continue this at current strength.  Other fatigue Orders Placed This Encounter  Procedures   CMP14+EGFR   CBC with Differential/Platelet   PSA   Lipid Panel With LDL/HDL Ratio   TSH   Vitamin D  (25 hydroxy)   Testosterone      Meds ordered this encounter  Medications   DISCONTD: topiramate  (TOPAMAX ) 100 MG tablet    Sig: Take one tab po daily.    Dispense:  90 tablet    Refill:  3   topiramate  (TOPAMAX ) 100 MG tablet    Sig: Take one tab po daily.    Dispense:  90 tablet    Refill:  3    Return in about 6 months (around 01/02/2025) for weight management.

## 2024-07-20 ENCOUNTER — Other Ambulatory Visit: Payer: Self-pay

## 2024-07-20 DIAGNOSIS — M7521 Bicipital tendinitis, right shoulder: Secondary | ICD-10-CM

## 2024-07-20 MED ORDER — DICLOFENAC SODIUM 75 MG PO TBEC: 75.0000 mg | DELAYED_RELEASE_TABLET | Freq: Two times a day (BID) | ORAL | 0 refills | Status: DC

## 2024-07-21 LAB — CMP14+EGFR
ALT: 20 IU/L (ref 0–44)
AST: 25 IU/L (ref 0–40)
Albumin: 4.6 g/dL (ref 3.8–4.9)
Alkaline Phosphatase: 56 IU/L (ref 47–123)
BUN/Creatinine Ratio: 15 (ref 9–20)
BUN: 15 mg/dL (ref 6–24)
Bilirubin Total: 0.5 mg/dL (ref 0.0–1.2)
CO2: 22 mmol/L (ref 20–29)
Calcium: 9.5 mg/dL (ref 8.7–10.2)
Chloride: 106 mmol/L (ref 96–106)
Creatinine, Ser: 0.99 mg/dL (ref 0.76–1.27)
Globulin, Total: 1.9 g/dL (ref 1.5–4.5)
Glucose: 83 mg/dL (ref 70–99)
Potassium: 4.1 mmol/L (ref 3.5–5.2)
Sodium: 143 mmol/L (ref 134–144)
Total Protein: 6.5 g/dL (ref 6.0–8.5)
eGFR: 92 mL/min/1.73 (ref >59)

## 2024-07-21 LAB — CBC WITH DIFFERENTIAL/PLATELET
Basophils Absolute: 0.1 x10E3/uL (ref 0.0–0.2)
Basos: 1 %
EOS (ABSOLUTE): 0.1 x10E3/uL (ref 0.0–0.4)
Eos: 2 %
Hematocrit: 45 % (ref 37.5–51.0)
Hemoglobin: 15.4 g/dL (ref 13.0–17.7)
Immature Grans (Abs): 0 x10E3/uL (ref 0.0–0.1)
Immature Granulocytes: 0 %
Lymphocytes Absolute: 1.7 x10E3/uL (ref 0.7–3.1)
Lymphs: 35 %
MCH: 32.6 pg (ref 26.6–33.0)
MCHC: 34.2 g/dL (ref 31.5–35.7)
MCV: 95 fL (ref 79–97)
Monocytes Absolute: 0.5 x10E3/uL (ref 0.1–0.9)
Monocytes: 10 %
Neutrophils Absolute: 2.6 x10E3/uL (ref 1.4–7.0)
Neutrophils: 52 %
Platelets: 229 x10E3/uL (ref 150–450)
RBC: 4.72 x10E6/uL (ref 4.14–5.80)
RDW: 12.3 % (ref 11.6–15.4)
WBC: 5 x10E3/uL (ref 3.4–10.8)

## 2024-07-21 LAB — VITAMIN D 25 HYDROXY (VIT D DEFICIENCY, FRACTURES): Vit D, 25-Hydroxy: 32.6 ng/mL (ref 30.0–100.0)

## 2024-07-21 LAB — TESTOSTERONE: Testosterone: 290 ng/dL (ref 264–916)

## 2024-07-21 LAB — LIPID PANEL WITH LDL/HDL RATIO
Cholesterol, Total: 184 mg/dL (ref 100–199)
HDL: 52 mg/dL (ref >39)
LDL Chol Calc (NIH): 113 mg/dL — ABNORMAL HIGH (ref 0–99)
LDL/HDL Ratio: 2.2 ratio (ref 0.0–3.6)
Triglycerides: 107 mg/dL (ref 0–149)
VLDL Cholesterol Cal: 19 mg/dL (ref 5–40)

## 2024-07-21 LAB — TSH: TSH: 0.735 u[IU]/mL (ref 0.450–4.500)

## 2024-07-21 LAB — PSA: Prostate Specific Ag, Serum: 1.1 ng/mL (ref 0.0–4.0)

## 2024-08-02 ENCOUNTER — Ambulatory Visit: Payer: Self-pay | Admitting: Family Medicine

## 2024-08-02 DIAGNOSIS — M7521 Bicipital tendinitis, right shoulder: Secondary | ICD-10-CM

## 2024-08-03 MED ORDER — DICLOFENAC SODIUM 75 MG PO TBEC: 75.0000 mg | DELAYED_RELEASE_TABLET | Freq: Two times a day (BID) | ORAL | 2 refills | Status: AC

## 2024-09-21 ENCOUNTER — Encounter: Payer: Self-pay | Admitting: Family Medicine

## 2024-09-30 NOTE — Telephone Encounter (Unsigned)
 Copied from CRM #[id number]. Topic: Clinical - Prescription Issue >> Sep 30, 2024 10:13 AM Rosaria BRAVO wrote: Reason for CRM: Pt called for status update on zepbound  request for prior authorization regarding new insurance. Seeking call back from the clinic today.   Best contact: [phone]

## 2024-10-01 ENCOUNTER — Telehealth: Payer: Self-pay

## 2024-10-01 ENCOUNTER — Other Ambulatory Visit (HOSPITAL_COMMUNITY): Payer: Self-pay

## 2024-10-01 ENCOUNTER — Other Ambulatory Visit: Payer: Self-pay

## 2024-10-01 DIAGNOSIS — E669 Obesity, unspecified: Secondary | ICD-10-CM

## 2024-10-01 NOTE — Progress Notes (Signed)
 Pharmacy Patient Advocate Encounter   Received notification from Physician's Office that prior authorization for ZEPBOUND  is required/requested.   Insurance verification completed.   The patient is insured through [employer].   Per test claim: Per test claim, medication is not covered due to plan/benefit exclusion, PA not submitted at this time

## 2024-10-01 NOTE — Telephone Encounter (Signed)
 Pharmacy Patient Advocate Encounter   Received notification from Physician's Office that prior authorization for ZEPBOUND  is required/requested.   Insurance verification completed.   The patient is insured through [employer].   Per test claim: Per test claim, medication is not covered due to plan/benefit exclusion, PA not submitted at this time

## 2024-10-04 MED ORDER — ZEPBOUND 10 MG/0.5ML ~~LOC~~ SOAJ: 10.0000 mg | SUBCUTANEOUS | 11 refills | Status: AC

## 2024-10-05 ENCOUNTER — Other Ambulatory Visit (HOSPITAL_COMMUNITY): Payer: Self-pay

## 2024-10-06 ENCOUNTER — Telehealth: Payer: Self-pay | Admitting: Family Medicine

## 2024-10-06 ENCOUNTER — Other Ambulatory Visit: Payer: Self-pay | Admitting: Family Medicine

## 2024-10-06 ENCOUNTER — Telehealth: Payer: Self-pay

## 2024-10-06 DIAGNOSIS — R69 Illness, unspecified: Secondary | ICD-10-CM

## 2024-10-06 NOTE — Telephone Encounter (Signed)
 Copied from CRM #[id number]. Topic: Clinical - Prescription Issue >> Oct 06, 2024  3:41 PM Fonda T wrote: Reason for CRM: Pt calling requesting to speak with office in regards to previous PA for medication, Zepbound .  Pt reports has been speaking with someone via mychart, and would like to speak with her over the phone. Reports that no one returns his calls regarding PA, and wants to expedite this request.  Pt reports most recent message was sent on 10/05/24, but has not received a response.  Called [employer], no response after multiple attempts.  Pt requesting a follow up call to advise further, can be reached at [phone].

## 2024-10-06 NOTE — Telephone Encounter (Signed)
 Attempted to return call to patient. Left my direct # for return call.

## 2024-10-06 NOTE — Telephone Encounter (Signed)
 Patient dropped off Zepbound  savings card

## 2025-01-03 ENCOUNTER — Ambulatory Visit: Admitting: Family Medicine

## 2025-03-15 ENCOUNTER — Encounter: Admitting: Sports Medicine
